# Patient Record
Sex: Male | Born: 1950 | Race: White | Hispanic: No | Smoking: Never smoker
Health system: Southern US, Community
[De-identification: ages and names within clinical notes are randomized; demographics above are authoritative.]

## PROBLEM LIST (undated history)

## (undated) DIAGNOSIS — E663 Overweight: Secondary | ICD-10-CM

## (undated) DIAGNOSIS — I499 Cardiac arrhythmia, unspecified: Secondary | ICD-10-CM

## (undated) DIAGNOSIS — E088 Diabetes mellitus due to underlying condition with unspecified complications: Secondary | ICD-10-CM

## (undated) DIAGNOSIS — I10 Essential (primary) hypertension: Secondary | ICD-10-CM

## (undated) DIAGNOSIS — E782 Mixed hyperlipidemia: Secondary | ICD-10-CM

## (undated) DIAGNOSIS — I480 Paroxysmal atrial fibrillation: Secondary | ICD-10-CM

## (undated) HISTORY — DX: Paroxysmal atrial fibrillation: I48.0

## (undated) HISTORY — DX: Essential (primary) hypertension: I10

## (undated) HISTORY — DX: Mixed hyperlipidemia: E78.2

## (undated) HISTORY — DX: Cardiac arrhythmia, unspecified: I49.9

## (undated) HISTORY — DX: Diabetes mellitus due to underlying condition with unspecified complications: E08.8

## (undated) HISTORY — DX: Overweight: E66.3

---

## 1999-07-23 ENCOUNTER — Ambulatory Visit: Admission: RE | Admit: 1999-07-23 | Discharge: 1999-07-23 | Payer: Self-pay

## 2015-04-26 ENCOUNTER — Other Ambulatory Visit: Payer: Self-pay | Admitting: Family

## 2015-04-26 DIAGNOSIS — R1030 Lower abdominal pain, unspecified: Secondary | ICD-10-CM

## 2015-04-26 DIAGNOSIS — R198 Other specified symptoms and signs involving the digestive system and abdomen: Secondary | ICD-10-CM

## 2015-05-01 ENCOUNTER — Ambulatory Visit
Admission: RE | Admit: 2015-05-01 | Discharge: 2015-05-01 | Disposition: A | Discharge status: Home / Self Care | Payer: BLUE CROSS/BLUE SHIELD | Source: Ambulatory Visit | Attending: Family | Admitting: Family

## 2015-05-01 DIAGNOSIS — R198 Other specified symptoms and signs involving the digestive system and abdomen: Secondary | ICD-10-CM

## 2015-05-01 DIAGNOSIS — R1030 Lower abdominal pain, unspecified: Secondary | ICD-10-CM

## 2015-05-01 MED ORDER — IOPAMIDOL (ISOVUE-300) INJECTION 61%
100.0000 mL | Freq: Once | INTRAVENOUS | Status: AC | PRN
  Administered 2015-05-01: 100 mL via INTRAVENOUS

## 2015-11-22 DIAGNOSIS — M7671 Peroneal tendinitis, right leg: Secondary | ICD-10-CM

## 2015-11-22 HISTORY — DX: Peroneal tendinitis, right leg: M76.71

## 2016-01-09 DIAGNOSIS — N529 Male erectile dysfunction, unspecified: Secondary | ICD-10-CM | POA: Insufficient documentation

## 2016-01-09 DIAGNOSIS — N401 Enlarged prostate with lower urinary tract symptoms: Secondary | ICD-10-CM

## 2016-01-09 DIAGNOSIS — E291 Testicular hypofunction: Secondary | ICD-10-CM

## 2016-01-09 DIAGNOSIS — N138 Other obstructive and reflux uropathy: Secondary | ICD-10-CM

## 2016-01-09 HISTORY — DX: Other obstructive and reflux uropathy: N13.8

## 2016-01-09 HISTORY — DX: Testicular hypofunction: E29.1

## 2016-01-09 HISTORY — DX: Male erectile dysfunction, unspecified: N52.9

## 2016-01-09 HISTORY — DX: Other obstructive and reflux uropathy: N40.1

## 2016-06-14 DIAGNOSIS — J9601 Acute respiratory failure with hypoxia: Secondary | ICD-10-CM

## 2016-06-14 DIAGNOSIS — J159 Unspecified bacterial pneumonia: Secondary | ICD-10-CM | POA: Diagnosis not present

## 2016-06-14 DIAGNOSIS — J189 Pneumonia, unspecified organism: Secondary | ICD-10-CM

## 2016-06-14 DIAGNOSIS — I4891 Unspecified atrial fibrillation: Secondary | ICD-10-CM

## 2016-06-15 DIAGNOSIS — J9601 Acute respiratory failure with hypoxia: Secondary | ICD-10-CM | POA: Diagnosis not present

## 2016-06-15 DIAGNOSIS — J189 Pneumonia, unspecified organism: Secondary | ICD-10-CM | POA: Diagnosis not present

## 2016-06-15 DIAGNOSIS — J159 Unspecified bacterial pneumonia: Secondary | ICD-10-CM | POA: Diagnosis not present

## 2016-06-15 DIAGNOSIS — I4891 Unspecified atrial fibrillation: Secondary | ICD-10-CM | POA: Diagnosis not present

## 2016-06-16 DIAGNOSIS — J189 Pneumonia, unspecified organism: Secondary | ICD-10-CM | POA: Diagnosis not present

## 2016-06-16 DIAGNOSIS — I4891 Unspecified atrial fibrillation: Secondary | ICD-10-CM | POA: Diagnosis not present

## 2016-06-16 DIAGNOSIS — J159 Unspecified bacterial pneumonia: Secondary | ICD-10-CM | POA: Diagnosis not present

## 2016-06-16 DIAGNOSIS — J9601 Acute respiratory failure with hypoxia: Secondary | ICD-10-CM | POA: Diagnosis not present

## 2016-12-16 ENCOUNTER — Encounter: Payer: Self-pay | Admitting: Cardiology

## 2016-12-16 ENCOUNTER — Ambulatory Visit (INDEPENDENT_AMBULATORY_CARE_PROVIDER_SITE_OTHER): Payer: Medicare PPO | Admitting: Cardiology

## 2016-12-16 VITALS — BP 126/80 | HR 63 | Ht 69.0 in | Wt 257.4 lb

## 2016-12-16 DIAGNOSIS — E663 Overweight: Secondary | ICD-10-CM | POA: Insufficient documentation

## 2016-12-16 DIAGNOSIS — E088 Diabetes mellitus due to underlying condition with unspecified complications: Secondary | ICD-10-CM

## 2016-12-16 DIAGNOSIS — I10 Essential (primary) hypertension: Secondary | ICD-10-CM | POA: Diagnosis not present

## 2016-12-16 DIAGNOSIS — E782 Mixed hyperlipidemia: Secondary | ICD-10-CM | POA: Diagnosis not present

## 2016-12-16 DIAGNOSIS — I480 Paroxysmal atrial fibrillation: Secondary | ICD-10-CM

## 2016-12-16 HISTORY — DX: Mixed hyperlipidemia: E78.2

## 2016-12-16 HISTORY — DX: Paroxysmal atrial fibrillation: I48.0

## 2016-12-16 HISTORY — DX: Overweight: E66.3

## 2016-12-16 HISTORY — DX: Diabetes mellitus due to underlying condition with unspecified complications: E08.8

## 2016-12-16 HISTORY — DX: Essential (primary) hypertension: I10

## 2016-12-16 NOTE — Patient Instructions (Signed)
Medication Instructions:   Your physician has recommended you make the following change in your medication:   CHANGE: Metoprolol from 25 mg twice daily to 25 mg ONCE daily   Labwork:  NONE  Testing/Procedures:  Your physician has recommended that you have a pulmonary function test. Pulmonary Function Tests are a group of tests that measure how well air moves in and out of your lungs.   Follow-Up:  Your physician recommends that you schedule a follow-up appointment in: 1 month with Dr. Tomie Chinaevankar.    Any Other Special Instructions Will Be Listed Below (If Applicable).     If you need a refill on your cardiac medications before your next appointment, please call your pharmacy.

## 2016-12-16 NOTE — Progress Notes (Signed)
Cardiology Office Note:    Date:  12/16/2016   ID:  Jesus Mccullough, DOB 12/02/1950, MRN [id number]  PCP:  [name], NP  Cardiologist:  Garwin Brothers, MD   Referring MD: No ref. provider found    ASSESSMENT:    1. PAF (paroxysmal atrial fibrillation) (HCC)   2. 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 Essential hypertension   4. Mixed dyslipidemia   5. Overweight    PLAN:    In order of problems listed above:  1. Paroxysmal atrial fibrillation. The patient has undergone ablation and we will await to see how successful this procedure has been. So far has not had any palpitations or any symptoms.I discussed with the patient atrial fibrillation, disease process. Management and therapy including rate and rhythm control, anticoagulation benefits and potential risks were discussed extensively with the patient. Patient had multiple questions which were answered to patient's satisfaction. 2. Patient's blood pressure stable. He feels a little tired 7, down his metoprolol from 25 twice a day to 25 mg in the morning. He'll be back in a month for a follow-up appointment. 3. Diet was discussed with dyslipidemia and diabetes mellitus. Patient will be seen in follow-up appointment in a month or earlier if he has any concerns. We'll also obtain pulmonary function tests as he is on amiodarone therapy   Medication Adjustments/Labs and Tests Ordered: Current medicines are reviewed at length with the patient today.  Concerns regarding medicines are outlined above.  No orders of the defined types were placed in this encounter.  No orders of the defined types were placed in this encounter.    History of Present Illness:    Jesus Mccullough is a 66 y.o. male who is being seen today for the evaluation of Paroxysmal atrial fibrillation . The patient has essential hypertension and diabetes mellitus and dyslipidemia. He  mentions to me that he has undergone ablation and he feels better. He just feels tired at times. At the time of my evaluation is alert awake oriented and in no distress. I evaluated the patient extensively and answered questions to his satisfaction. His no chest pain orthopnea or PND. He leads a sedentary lifestyle. He has not felt any palpitations after the procedure. He wants to follow-up with me.  Taken care of this gentleman previously and now he wants to get established at this practice.  Past Medical History:  Diagnosis Date  . Arrhythmia     History reviewed. No pertinent surgical history.  Current Medications: Current Meds  Medication Sig  . allopurinol (ZYLOPRIM) 300 MG tablet Take 300 mg by mouth daily.  Marland Kitchen amiodarone (PACERONE) 200 MG tablet Take 200 mg by mouth daily.  . empagliflozin (JARDIANCE) 10 MG TABS tablet Take 10 mg by mouth daily.  Marland Kitchen gabapentin (NEURONTIN) 800 MG tablet Take 800 mg by mouth 3 (three) times daily.  Marland Kitchen glipiZIDE (GLUCOTROL) 10 MG tablet Take 10 mg by mouth daily.  . metFORMIN (GLUCOPHAGE) 500 MG tablet Take 1,000 mg by mouth 2 (two) times daily.  . metoprolol tartrate (LOPRESSOR) 50 MG tablet Take 25 mg by mouth 2 (two) times daily.  Marland Kitchen omeprazole (PRILOSEC) 40 MG capsule Take 40 mg by mouth daily.  . pravastatin (PRAVACHOL) 40 MG tablet Take 40 mg by mouth daily.  . tamsulosin (FLOMAX) 0.4 MG CAPS capsule Take 0.4 mg by mouth daily.  Marland Kitchen testosterone cypionate (DEPOTESTOSTERONE CYPIONATE) 200 MG/ML injection Inject  into the muscle every 21 ( twenty-one) days.  [name]. XARELTO 20 MG TABS tablet Take 20 mg by mouth daily.     Allergies:   Antihistamines, chlorpheniramine-type; Aspirin; and Nyquil multi-symptom  [pseudoeph-doxylamine-dm-apap]   Social History   Social History  . Marital status: Married    Spouse name: N/A  . Number of children: N/A  . Years of education: N/A   Social History Main Topics  . Smoking status: Never Smoker  . Smokeless  tobacco: Never Used  . Alcohol use No  . Drug use: No  . Sexual activity: Not Asked   Other Topics Concern  . None   Social History Narrative  . None     Family History: The patient's family history includes Heart attack in his maternal uncle; Heart disease in his maternal uncle and mother.  ROS:   Please see the history of present illness.    All other systems reviewed and are negative.  EKGs/Labs/Other Studies Reviewed:    The following studies were reviewed today: I reviewed records and office records extensively and answered questions to patient's satisfaction. EKG done today revealed sinus rhythm, first-degree AV block and nonspecific ST-T changes   Recent Labs: No results found for requested labs within last 8760 hours.  Recent Lipid Panel No results found for: CHOL, TRIG, HDL, CHOLHDL, VLDL, LDLCALC, LDLDIRECT  Physical Exam:    VS:  BP 126/80   Pulse 63   Ht 5\' 9"  (1.753 m)   Wt 257 lb 6.4 oz (116.8 kg)   SpO2 98%   BMI 38.01 kg/m     Wt Readings from Last 3 Encounters:  12/16/16 257 lb 6.4 oz (116.8 kg)     GEN: Patient is in no acute distress HEENT: Normal NECK: No JVD; No carotid bruits LYMPHATICS: No lymphadenopathy CARDIAC: S1 S2 regular, 2/6 systolic murmur at the apex. RESPIRATORY:  Clear to auscultation without rales, wheezing or rhonchi  ABDOMEN: Soft, non-tender, non-distended MUSCULOSKELETAL:  No edema; No deformity  SKIN: Warm and dry NEUROLOGIC:  Alert and oriented x 3 PSYCHIATRIC:  Normal affect    Signed, Garwin Brothersajan R [name], MD  12/16/2016 9:28 AM    [facility]

## 2016-12-24 ENCOUNTER — Telehealth: Payer: Self-pay | Admitting: Cardiology

## 2016-12-24 NOTE — Telephone Encounter (Signed)
Waiting on a call back about his breathing test

## 2016-12-25 ENCOUNTER — Telehealth: Payer: Self-pay

## 2016-12-25 NOTE — Telephone Encounter (Signed)
Patient has called 3 times to discuss sleep test please call today.cn

## 2016-12-25 NOTE — Telephone Encounter (Signed)
Called [facility] to check the status of PFT study. LM to call back or to fax results.

## 2016-12-25 NOTE — Telephone Encounter (Signed)
Pt missed 1st scheduled app for PFT study at dr Chodri's office. This have been reschduled 9/17 @ 1:30 pt is aware  Of this.

## 2017-01-15 ENCOUNTER — Ambulatory Visit (INDEPENDENT_AMBULATORY_CARE_PROVIDER_SITE_OTHER): Payer: Medicare PPO | Admitting: Cardiology

## 2017-01-15 ENCOUNTER — Encounter: Payer: Self-pay | Admitting: Cardiology

## 2017-01-15 ENCOUNTER — Ambulatory Visit: Payer: Medicare PPO | Admitting: Cardiology

## 2017-01-15 VITALS — BP 112/74 | HR 69 | Ht 69.0 in | Wt 259.0 lb

## 2017-01-15 DIAGNOSIS — I480 Paroxysmal atrial fibrillation: Secondary | ICD-10-CM | POA: Diagnosis not present

## 2017-01-15 DIAGNOSIS — E782 Mixed hyperlipidemia: Secondary | ICD-10-CM | POA: Diagnosis not present

## 2017-01-15 DIAGNOSIS — I10 Essential (primary) hypertension: Secondary | ICD-10-CM

## 2017-01-15 DIAGNOSIS — E663 Overweight: Secondary | ICD-10-CM

## 2017-01-15 DIAGNOSIS — E088 Diabetes mellitus due to underlying condition with unspecified complications: Secondary | ICD-10-CM | POA: Diagnosis not present

## 2017-01-15 NOTE — Patient Instructions (Signed)
Medication Instructions:  Xarelto samples given  eliquis  twice daily pradaxa 150 mg twice daily  Labwork: None  Testing/Procedures: You had an EKG  Follow-Up: Dr. Johney Frame will call for an appointment  Any Other Special Instructions Will Be Listed Below (If Applicable).     If you need a refill on your cardiac medications before your next appointment, please call your pharmacy.

## 2017-01-15 NOTE — Addendum Note (Signed)
Addended by: Craige Cotta on: 01/15/2017 04:42 PM   Modules accepted: Orders

## 2017-01-15 NOTE — Progress Notes (Signed)
Cardiology Office Note:    Date:  01/15/2017   ID:  Jesus Mccullough, DOB Jul 02, 1950, MRN [id number]  PCP:  [name], NP  Cardiologist:  Garwin Brothers, MD   Referring MD: [name], NP    ASSESSMENT:    1. Essential hypertension   2. PAF (paroxysmal atrial fibrillation) (HCC)   3. 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 Mixed dyslipidemia   5. Overweight    PLAN:    In order of problems listed above:  1. I discussed my findings with the patient at extensive length. It appears that he will need to be evaluated electrophysiology colleagues. I we will refer him to our electrophysiology colleagues who has interest in atrial fibrillation Dr. Johney Frame and this Dr. Stann Mainland decide whether the patient needs any further evaluation for possible ablation therapy or medications such as dofetilide and to see whether he needs long-term amiodarone. 2. I stressed the importance of uninterrupted anticoagulation and he is on this at this time. 3. Patient's blood pressure stable and diet was discussed with dyslipidemia and diabetes mellitus. Weight reduction was stressed.We will be seen in follow-up appointment in 6 months or earlier if he has any concerns.   Medication Adjustments/Labs and Tests Ordered: Current medicines are reviewed at length with the patient today.  Concerns regarding medicines are outlined above.  Orders Placed This Encounter  Procedures  . EKG 12-Lead   No orders of the defined types were placed in this encounter.    Chief Complaint  Patient presents with  . Follow-up    PFT Study done at Dr. Terrilee Croak office      History of Present Illness:    Jesus Mccullough is a 66 y.o. male . Patient has history of atrial fibrillation. He has undergone ablation at [facility].Is here for follow-up. He denies any problems at this time except fatigue on exertion. No chest pain  orthopnea or PND. He is on anticoagulation. Patient takes 200 mg of amiodarone on a regular basis. At the time of my evaluation is alert awake oriented and in no distress.  Past Medical History:  Diagnosis Date  . Arrhythmia     History reviewed. No pertinent surgical history.  Current Medications: Current Meds  Medication Sig  . allopurinol (ZYLOPRIM) 300 MG tablet Take 300 mg by mouth daily.  Marland Kitchen amiodarone (PACERONE) 200 MG tablet Take 200 mg by mouth daily.  . empagliflozin (JARDIANCE) 10 MG TABS tablet Take 10 mg by mouth daily.  Marland Kitchen gabapentin (NEURONTIN) 800 MG tablet Take 800 mg by mouth 3 (three) times daily.  Marland Kitchen glipiZIDE (GLUCOTROL) 10 MG tablet Take 10 mg by mouth daily.  . metFORMIN (GLUCOPHAGE) 500 MG tablet Take 1,000 mg by mouth 2 (two) times daily.  . metoprolol tartrate (LOPRESSOR) 50 MG tablet Take 25 mg by mouth 2 (two) times daily.  Marland Kitchen omeprazole (PRILOSEC) 40 MG capsule Take 40 mg by mouth daily.  . pravastatin (PRAVACHOL) 40 MG tablet Take 40 mg by mouth daily.  . tamsulosin (FLOMAX) 0.4 MG CAPS capsule Take 0.4 mg by mouth daily.  Marland Kitchen testosterone cypionate (DEPOTESTOSTERONE CYPIONATE) 200 MG/ML injection Inject into the muscle every 21 ( twenty-one) days.  [name] 20 MG TABS tablet Take 20 mg by mouth daily.     Allergies:   Antihistamines, chlorpheniramine-type; Aspirin; and Nyquil multi-symptom  [pseudoeph-doxylamine-dm-apap]   Social History   Social History  .  Marital status: Married    Spouse name: N/A  . Number of children: N/A  . Years of education: N/A   Social History Main Topics  . Smoking status: Never Smoker  . Smokeless tobacco: Never Used  . Alcohol use No  . Drug use: No  . Sexual activity: Not Asked   Other Topics Concern  . None   Social History Narrative  . None     Family History: The patient's family history includes Heart attack in his maternal uncle; Heart disease in his maternal uncle and mother.  ROS:   Please see the  history of present illness.    All other systems reviewed and are negative.  EKGs/Labs/Other Studies Reviewed:    The following studies were reviewed today: I discussed my findings with the patient today. EKG reveals atrial fibrillation with nonspecific ST-T changes and heart rate is fairly well-controlled.   Recent Labs: No results found for requested labs within last 8760 hours.  Recent Lipid Panel No results found for: CHOL, TRIG, HDL, CHOLHDL, VLDL, LDLCALC, LDLDIRECT  Physical Exam:    VS:  BP 112/74   Pulse 69   Ht  (1.753 m)   Wt 259 lb (117.5 kg)   SpO2 94%   BMI 38.25 kg/m     Wt Readings from Last 3 Encounters:  01/15/17 259 lb (117.5 kg)  12/16/16 257 lb 6.4 oz (116.8 kg)     GEN: Patient is in no acute distress HEENT: Normal NECK: No JVD; No carotid bruits LYMPHATICS: No lymphadenopathy CARDIAC: Hear sounds regular, 2/6 systolic murmur at the apex. RESPIRATORY:  Clear to auscultation without rales, wheezing or rhonchi  ABDOMEN: Soft, non-tender, non-distended MUSCULOSKELETAL:  No edema; No deformity  SKIN: Warm and dry NEUROLOGIC:  Alert and oriented x 3 PSYCHIATRIC:  Normal affect   Signed, Garwin Brothers, MD  01/15/2017 4:34 PM    [facility]

## 2017-01-21 ENCOUNTER — Ambulatory Visit (INDEPENDENT_AMBULATORY_CARE_PROVIDER_SITE_OTHER): Payer: Medicare PPO | Admitting: Cardiology

## 2017-01-21 ENCOUNTER — Encounter (INDEPENDENT_AMBULATORY_CARE_PROVIDER_SITE_OTHER): Payer: Self-pay

## 2017-01-21 ENCOUNTER — Encounter: Payer: Self-pay | Admitting: Cardiology

## 2017-01-21 VITALS — BP 126/84 | HR 69 | Ht 69.0 in | Wt 259.8 lb

## 2017-01-21 DIAGNOSIS — I10 Essential (primary) hypertension: Secondary | ICD-10-CM

## 2017-01-21 DIAGNOSIS — E785 Hyperlipidemia, unspecified: Secondary | ICD-10-CM

## 2017-01-21 DIAGNOSIS — I481 Persistent atrial fibrillation: Secondary | ICD-10-CM

## 2017-01-21 DIAGNOSIS — I4819 Other persistent atrial fibrillation: Secondary | ICD-10-CM

## 2017-01-21 NOTE — Patient Instructions (Signed)
Medication Instructions: Your physician recommends that you continue on your current medications as directed. Please refer to the Current Medication list given to you today.   Labwork: None ordered  Procedures/Testing: None ordered  Follow-Up: Your physician recommends that you schedule a follow-up appointment in: December, 2018 at our [facility] office with Dr. Elberta Fortis.   Any Additional Special Instructions Will Be Listed Below (If Applicable).     If you need a refill on your cardiac medications before your next appointment, please call your pharmacy.

## 2017-01-21 NOTE — Progress Notes (Signed)
Electrophysiology Office Note   Date:  01/21/2017   ID:  Jesus Mccullough, Jesus Mccullough [birth year], MRN [id number]  PCP:  [name], NP  Cardiologist:  [name] Primary Electrophysiologist:  [name], MD    Chief Complaint  Patient presents with  . Advice Only    PAF     History of Present Illness: Jesus Mccullough is a 66 y.o. male who is being seen today for the evaluation of atrial fibrillation at the request of [name], [name], NP. Presenting today for electrophysiology evaluation. He has a history of paroxysmal atrial fibrillation, diabetes, hypertension, and hyperlipidemia. He had ablation for atrial fibrillation at [facility]. He returned to clinic in October 2018 and was found to be in recurrent atrial fibrillation. He had cryoablation on 11/25/16. According to the patient, he does not have symptoms from his atrial fibrillation. He has no shortness of breath, fatigue or palpitations.    Today, he denies symptoms of palpitations, chest pain, shortness of breath, orthopnea, PND, lower extremity edema, claudication, dizziness, presyncope, syncope, bleeding, or neurologic sequela. The patient is tolerating medications without difficulties.    Past Medical History:  Diagnosis Date  . Arrhythmia   . Diabetes mellitus due to underlying condition with unspecified complications (HCC) 12/16/2016  . Essential hypertension 12/16/2016  . Mixed dyslipidemia 12/16/2016  . Overweight 12/16/2016  . PAF (paroxysmal atrial fibrillation) (HCC) 12/16/2016   No past surgical history on file.   Current Outpatient Prescriptions  Medication Sig Dispense Refill  . allopurinol (ZYLOPRIM) 300 MG tablet Take 300 mg by mouth daily.    Marland Kitchen amiodarone (PACERONE) 200 MG tablet Take 200 mg by mouth daily.    . Cholecalciferol (VITAMIN D3) 1000 units CAPS Take 1 capsule by mouth daily.    . empagliflozin (JARDIANCE) 10 MG TABS tablet Take 10 mg by mouth daily.    Marland Kitchen gabapentin (NEURONTIN) 800 MG  tablet Take 800 mg by mouth 3 (three) times daily.    Marland Kitchen glipiZIDE (GLUCOTROL) 10 MG tablet Take 10 mg by mouth daily.    . metFORMIN (GLUCOPHAGE) 500 MG tablet Take 1,000 mg by mouth 2 (two) times daily.    . metoprolol tartrate (LOPRESSOR) 50 MG tablet Take 25 mg by mouth 2 (two) times daily.    . Omega-3 Fatty Acids (FISH OIL) 1000 MG CAPS Take 1 capsule by mouth daily.    Marland Kitchen omeprazole (PRILOSEC) 40 MG capsule Take 40 mg by mouth daily.    Marland Kitchen POTASSIUM PO Take 1 tablet by mouth daily.    . pravastatin (PRAVACHOL) 40 MG tablet Take 40 mg by mouth daily.    . tamsulosin (FLOMAX) 0.4 MG CAPS capsule Take 0.4 mg by mouth daily.    Marland Kitchen testosterone cypionate (DEPOTESTOSTERONE CYPIONATE) 200 MG/ML injection Inject into the muscle every 21 ( twenty-one) days.    [name] 20 MG TABS tablet Take 20 mg by mouth daily.     No current facility-administered medications for this visit.     Allergies:   Antihistamines, chlorpheniramine-type; Aspirin; and Nyquil multi-symptom  [pseudoeph-doxylamine-dm-apap]   Social History:  The patient  reports that he has never smoked. He has never used smokeless tobacco. He reports that he does not drink alcohol or use drugs.   Family History:  The patient's family history includes Heart attack in his maternal uncle; Heart disease in his maternal uncle and mother.    ROS:  Please see the history of present illness.   Otherwise, review of  systems is positive for none.   All other systems are reviewed and negative.    PHYSICAL EXAM: VS:  BP 126/84   Pulse 69   Ht  (1.753 m)   Wt 259 lb 12.8 oz (117.8 kg)   SpO2 93%   BMI 38.37 kg/m  , BMI Body mass index is 38.37 kg/m. GEN: Well nourished, well developed, in no acute distress  HEENT: normal  Neck: no JVD, carotid bruits, or masses Cardiac: RRR; no murmurs, rubs, or gallops,no edema  Respiratory:  clear to auscultation bilaterally, normal work of breathing GI: soft, nontender, nondistended, + BS MS: no  deformity or atrophy  Skin: warm and dry Neuro:  Strength and sensation are intact Psych: euthymic mood, full affect  EKG:  EKG is ordered today. Personal review of the ekg ordered 01/15/17 shows atrial fibrillation, rate 95   Recent Labs: No results found for requested labs within last 8760 hours.    Lipid Panel  No results found for: CHOL, TRIG, HDL, CHOLHDL, VLDL, LDLCALC, LDLDIRECT   Wt Readings from Last 3 Encounters:  01/21/17 259 lb 12.8 oz (117.8 kg)  01/15/17 259 lb (117.5 kg)  12/16/16 257 lb 6.4 oz (116.8 kg)      Other studies Reviewed: Additional studies/ records that were reviewed today include: TEE 11/21/16 Review of the above records today demonstrates:  Normal mitral valve structure and mobility. Mild mitral regurgitation. Aortic Valve Normal tricuspid aortic valve with pliable leaflets, no stenosis or insufficiency. Tricuspid Valve Structurally normal tricuspid valve with no stenosis. Pulmonic Valve Normal pulmonic valve structure and mobility. Left Atrium Moderately dilated left atrium. Left Ventricle Normal left ventricle size and systolic function, with no segmental abnormality. Right Atrium Normal right atrium. Right Ventricle Normal right ventricle structure and function. Pericardial Effusion No evidence of pericardial effusion. Pleural Effusion No evidence of pleural effusion. Miscellaneous The aorta is within normal limits. IVC is normal and collapses   ASSESSMENT AND PLAN:  1.  Paroxysmal atrial fibrillation: Status post cryoablation 11/23/16. Currently on amiodarone and Xarelto. He came in the cardiology clinic and was noted to be in atrial fibrillation and thus was referred. He is back in sinus rhythm now based on auscultation. He is still within his three-month blanking period for atrial fibrillation ablation and thus I do not feel like any medication changes are needed. I did discuss with him possible med changes in the future as well  as repeat ablation which she would be amenable to. I Lilyanna Lunt see him back in December for further discussions.  This patients CHA2DS2-VASc Score and unadjusted Ischemic Stroke Rate (% per year) is equal to 2.2 % stroke rate/year from a score of 2  Above score calculated as 1 point each if present [CHF, HTN, DM, Vascular=MI/PAD/Aortic Plaque, Age if 65-74, or Male] Above score calculated as 2 points each if present [Age > 75, or Stroke/TIA/TE]  2. Hypertension: Well-controlled today. No changes.  3. Hyperlipidemia: Continue pravastatin per primary cardiology.   Current medicines are reviewed at length with the patient today.   The patient does not have concerns regarding his medicines.  The following changes were made today:  none  Labs/ tests ordered today include:  No orders of the defined types were placed in this encounter.    Disposition:   FU with Jerianne Anselmo 2 months  Signed, Issa Kosmicki Jorja Loa, MD  01/21/2017 2:34 PM     [facility] [address] [place] [address] [phone] (office) (  [phone] (fax)

## 2017-02-24 ENCOUNTER — Telehealth: Payer: Self-pay

## 2017-02-24 NOTE — Telephone Encounter (Signed)
Attempted to inform the patient of her PFT results; however, the number on file is currently not in service.

## 2017-03-17 NOTE — Progress Notes (Signed)
Electrophysiology Office Note   Date:  03/18/2017   ID:  Jesus Mccullough, DOB 08/09/1950, MRN [id number]  PCP:  Jesus SeverinYork, Jesus F, NP  Cardiologist:  Jesus Mccullough Primary Electrophysiologist:  Jesus LemmingWill Martin Cordie Buening, MD    Chief Complaint  Patient presents with  . Follow-up    PAF     History of Present Illness: Jesus Mccullough is a 66 y.o. male who is being seen today for the evaluation of atrial fibrillation at the request of Mccullough, Jesus F, NP. Presenting today for electrophysiology evaluation. He has a history of paroxysmal atrial fibrillation, diabetes, hypertension, and hyperlipidemia. He had ablation for atrial fibrillation at [facility]. He returned to clinic in October 2018 and was found to be in recurrent atrial fibrillation. He had cryoablation on 11/25/16. According to the patient, he does not have symptoms from his atrial fibrillation. He has no shortness of breath, fatigue or palpitations.  Today, denies symptoms of palpitations, chest pain, shortness of breath, orthopnea, PND, lower extremity edema, claudication, dizziness, presyncope, syncope, bleeding, or neurologic sequela. The patient is tolerating medications without difficulties.  And feeling well without major complaint.  He does not have fatigue, weakness, or shortness of breath.   Past Medical History:  Diagnosis Date  . Arrhythmia   . Diabetes mellitus due to underlying condition with unspecified complications (HCC) 12/16/2016  . Essential hypertension 12/16/2016  . Mixed dyslipidemia 12/16/2016  . Overweight 12/16/2016  . PAF (paroxysmal atrial fibrillation) (HCC) 12/16/2016   No past surgical history on file.   Current Outpatient Medications  Medication Sig Dispense Refill  . allopurinol (ZYLOPRIM) 300 MG tablet Take 300 mg by mouth daily.    Marland Kitchen. amiodarone (PACERONE) 200 MG tablet Take 200 mg by mouth daily.    . Cholecalciferol (VITAMIN D3) 1000 units CAPS Take 1 capsule by mouth daily.    . empagliflozin  (JARDIANCE) 10 MG TABS tablet Take 10 mg by mouth daily.    Marland Kitchen. glipiZIDE (GLUCOTROL) 10 MG tablet Take 10 mg by mouth daily.    Marland Kitchen. lisinopril (PRINIVIL,ZESTRIL) 10 MG tablet Take 10 mg by mouth 2 (two) times daily.    . metFORMIN (GLUCOPHAGE) 500 MG tablet Take 1,000 mg by mouth 2 (two) times daily.    . metoprolol tartrate (LOPRESSOR) 50 MG tablet Take 25 mg by mouth 2 (two) times daily.    . Omega-3 Fatty Acids (FISH OIL) 1000 MG CAPS Take 1 capsule by mouth daily.    Marland Kitchen. omeprazole (PRILOSEC) 40 MG capsule Take 40 mg by mouth daily.    Marland Kitchen. POTASSIUM PO Take 1 tablet by mouth daily.    . pravastatin (PRAVACHOL) 40 MG tablet Take 40 mg by mouth daily.    . pregabalin (LYRICA) 150 MG capsule Take 150 mg by mouth 2 (two) times daily.    . tamsulosin (FLOMAX) 0.4 MG CAPS capsule Take 0.4 mg by mouth daily.    Marland Kitchen. testosterone cypionate (DEPOTESTOSTERONE CYPIONATE) 200 MG/ML injection Inject into the muscle every 21 ( twenty-one) days.    Jesus Mccullough. XARELTO 20 MG TABS tablet Take 20 mg by mouth daily.     No current facility-administered medications for this visit.     Allergies:   Antihistamines, chlorpheniramine-type; Aspirin; and Nyquil multi-symptom  [pseudoeph-doxylamine-dm-apap]   Social History:  The patient  reports that  has never smoked. he has never used smokeless tobacco. He reports that he does not drink alcohol or use drugs.   Family History:  The patient's family history includes  Heart attack in his maternal uncle; Heart disease in his maternal uncle and mother.   ROS:  Please see the history of present illness.   Otherwise, review of systems is positive for none.   All other systems are reviewed and negative.   PHYSICAL EXAM: VS:  BP 132/81   Pulse (!) 52   Ht 5\' 9"  (1.753 m)   Wt 261 lb (118.4 kg)   SpO2 93%   BMI 38.54 kg/m  , BMI Body mass index is 38.54 kg/m. GEN: Well nourished, well developed, in no acute distress  HEENT: normal  Neck: no JVD, carotid bruits, or masses Cardiac:  RRR; no murmurs, rubs, or gallops,no edema  Respiratory:  clear to auscultation bilaterally, normal work of breathing GI: soft, nontender, nondistended, + BS MS: no deformity or atrophy  Skin: warm and dry Neuro:  Strength and sensation are intact Psych: euthymic mood, full affect  EKG:  EKG is ordered today. Personal review of the ekg ordered shows low atrial rhythm, possible inferior infarct, occasional PVCs, possible anteroseptal infarct, rate 68  Recent Labs: No results found for requested labs within last 8760 hours.  Lipid Panel  No results found for: CHOL, TRIG, HDL, CHOLHDL, VLDL, LDLCALC, LDLDIRECT   Wt Readings from Last 3 Encounters:  03/18/17 261 lb (118.4 kg)  01/21/17 259 lb 12.8 oz (117.8 kg)  01/15/17 259 lb (117.5 kg)      Other studies Reviewed: Additional studies/ records that were reviewed today include: TEE 11/21/16 Review of the above records today demonstrates:  Normal mitral valve structure and mobility. Mild mitral regurgitation. Aortic Valve Normal tricuspid aortic valve with pliable leaflets, no stenosis or insufficiency. Tricuspid Valve Structurally normal tricuspid valve with no stenosis. Pulmonic Valve Normal pulmonic valve structure and mobility. Left Atrium Moderately dilated left atrium. Left Ventricle Normal left ventricle size and systolic function, with no segmental abnormality. Right Atrium Normal right atrium. Right Ventricle Normal right ventricle structure and function. Pericardial Effusion No evidence of pericardial effusion. Pleural Effusion No evidence of pleural effusion. Miscellaneous The aorta is within normal limits. IVC is normal and collapses   ASSESSMENT AND PLAN:  1.  Paroxysmal atrial fibrillation: Cryoablation 11/23/16.  Currently on amiodarone and Xarelto.  Did have a early return of atrial fibrillation but is now in a low atrial rhythm.  Due to that, we Jesus Mccullough continue amiodarone for the time being likely be  able to stop amiodarone at his next visit.     This patients CHA2DS2-VASc Score and unadjusted Ischemic Stroke Rate (% per year) is equal to 2.2 % stroke rate/year from a score of 2  Above score calculated as 1 point each if present [CHF, HTN, DM, Vascular=MI/PAD/Aortic Plaque, Age if 65-74, or Male] Above score calculated as 2 points each if present [Age > 75, or Stroke/TIA/TE]   2. Hypertension: Well-controlled today.  No changes  3. Hyperlipidemia: Pravastatin per primary cardiology   Current medicines are reviewed at length with the patient today.   The patient does not have concerns regarding his medicines.  The following changes were made today:  none  Labs/ tests ordered today include:  Orders Placed This Encounter  Procedures  . TSH  . Hepatic function panel     Disposition:   FU with Dannah Ryles 6 months  Signed, Jamone Garrido Jorja LoaMartin Shaili Donalson, MD  03/18/2017 3:52 PM     [facility] [address] [place] [address] [phone] (office) [phone] (fax)

## 2017-03-18 ENCOUNTER — Encounter: Payer: Self-pay | Admitting: Cardiology

## 2017-03-18 ENCOUNTER — Ambulatory Visit: Payer: Medicare PPO | Admitting: Cardiology

## 2017-03-18 VITALS — BP 132/81 | HR 52 | Ht 69.0 in | Wt 261.0 lb

## 2017-03-18 DIAGNOSIS — I480 Paroxysmal atrial fibrillation: Secondary | ICD-10-CM

## 2017-03-18 DIAGNOSIS — E785 Hyperlipidemia, unspecified: Secondary | ICD-10-CM | POA: Diagnosis not present

## 2017-03-18 DIAGNOSIS — I10 Essential (primary) hypertension: Secondary | ICD-10-CM

## 2017-03-18 DIAGNOSIS — Z79899 Other long term (current) drug therapy: Secondary | ICD-10-CM

## 2017-03-18 NOTE — Patient Instructions (Signed)
Medication Instructions:  Your physician recommends that you continue on your current medications as directed. Please refer to the Current Medication list given to you today.  * If you need a refill on your cardiac medications before your next appointment, please call your pharmacy. *  Labwork: Amiodarone medication surveillance lab work: TSH & LFT  Testing/Procedures: None ordered  Follow-Up: Your physician wants you to follow-up in: 6 months with Dr. [name].  You will receive a reminder letter in the mail two months in advance. If you don't receive a letter, please call our office to schedule the follow-up appointment.  Thank you for choosing [facility]!!   Dory HornSherri Latisia Hilaire, RN [phone]

## 2017-03-19 LAB — HEPATIC FUNCTION PANEL
ALBUMIN: 4.6 g/dL (ref 3.6–4.8)
ALK PHOS: 69 IU/L (ref 39–117)
ALT: 18 IU/L (ref 0–44)
AST: 20 IU/L (ref 0–40)
Bilirubin Total: 0.3 mg/dL (ref 0.0–1.2)
Bilirubin, Direct: 0.12 mg/dL (ref 0.00–0.40)
TOTAL PROTEIN: 7.4 g/dL (ref 6.0–8.5)

## 2017-03-19 LAB — TSH: TSH: 15.12 u[IU]/mL — ABNORMAL HIGH (ref 0.450–4.500)

## 2017-03-19 NOTE — Addendum Note (Signed)
Addended by: Baird LyonsPRICE, Tahra Hitzeman L on: 03/19/2017 06:02 PM   Modules accepted: Orders

## 2017-04-01 ENCOUNTER — Telehealth: Payer: Self-pay

## 2017-04-01 NOTE — Telephone Encounter (Signed)
Was finally able to get in touch with the patient as his phone was out of service previously. Patient was informed of his PFT results and requested that they be repeated in 1 year.

## 2017-06-23 DIAGNOSIS — I951 Orthostatic hypotension: Secondary | ICD-10-CM

## 2017-06-23 DIAGNOSIS — Z7901 Long term (current) use of anticoagulants: Secondary | ICD-10-CM

## 2017-06-23 HISTORY — DX: Long term (current) use of anticoagulants: Z79.01

## 2017-06-23 HISTORY — DX: Orthostatic hypotension: I95.1

## 2017-07-23 ENCOUNTER — Encounter: Payer: Self-pay | Admitting: Cardiology

## 2017-07-23 ENCOUNTER — Ambulatory Visit: Payer: Medicare PPO | Admitting: Cardiology

## 2017-07-23 VITALS — BP 130/70 | HR 81 | Ht 69.0 in | Wt 252.0 lb

## 2017-07-23 DIAGNOSIS — I10 Essential (primary) hypertension: Secondary | ICD-10-CM | POA: Diagnosis not present

## 2017-07-23 DIAGNOSIS — E088 Diabetes mellitus due to underlying condition with unspecified complications: Secondary | ICD-10-CM | POA: Diagnosis not present

## 2017-07-23 DIAGNOSIS — I480 Paroxysmal atrial fibrillation: Secondary | ICD-10-CM

## 2017-07-23 NOTE — Progress Notes (Signed)
Cardiology Office Note:    Date:  07/23/2017   ID:  Jesus Mccullough, DOB 1950/05/29, MRN [id number]  PCP:  [name], NP  Cardiologist:  Garwin Brothers, MD   Referring MD: [name], NP    ASSESSMENT:    1. PAF (paroxysmal atrial fibrillation) (HCC)   2. Essential hypertension    PLAN:    In order of problems listed above:  1. I discussed my findings with the patient at extensive length.  His heart rate is well controlled and he is on optimal anticoagulation.  In view of significant recurrent tachyarrhythmias which are symptomatic, I told him that I will make him a referral to Dr. Johney Frame for a second opinion.  This is a young gentleman and he is on amiodarone therapy.  I think he could get another evaluation for possible ablation and if this is successful then he may be able to go off some of his medications.  He is excited and interested to learn more about this.  At this time he will continue his current medications and we will refer him to our electrophysiology colleague for assessment at that direction. 2. Patient will be seen in follow-up appointment in 6 months or earlier if the patient has any concerns    Medication Adjustments/Labs and Tests Ordered: Current medicines are reviewed at length with the patient today.  Concerns regarding medicines are outlined above.  Orders Placed This Encounter  Procedures  . Ambulatory referral to Cardiac Electrophysiology  . EKG 12-Lead   No orders of the defined types were placed in this encounter.    Chief Complaint  Patient presents with  . Follow-up  . Atrial Fibrillation     History of Present Illness:    Jesus Mccullough is a 67 y.o. male.  Patient has paroxysmal atrial fibrillation.  He has been on amiodarone therapy in the past.  He has also undergone ablation which has been unsuccessful.  The patient is frustrated because recently was admitted to the hospital with tachyarrhythmias and also significant  hypotension.  He denies any chest pain orthopnea or PND.  He takes care of activities of daily living.  No chest pain orthopnea or PND.  He is worried that the symptoms may come back again and its her quality of life issue for him.  At the time of my evaluation, the patient is alert awake oriented and in no distress.  Past Medical History:  Diagnosis Date  . Arrhythmia   . Diabetes mellitus due to underlying condition with unspecified complications (HCC) 12/16/2016  . Essential hypertension 12/16/2016  . Mixed dyslipidemia 12/16/2016  . Overweight 12/16/2016  . PAF (paroxysmal atrial fibrillation) (HCC) 12/16/2016    History reviewed. No pertinent surgical history.  Current Medications: Current Meds  Medication Sig  . allopurinol (ZYLOPRIM) 300 MG tablet Take 300 mg by mouth daily.  Marland Kitchen amiodarone (PACERONE) 200 MG tablet Take 200 mg by mouth daily.  Marland Kitchen atorvastatin (LIPITOR) 10 MG tablet   . Cholecalciferol (VITAMIN D3) 1000 units CAPS Take 1 capsule by mouth daily.  . empagliflozin (JARDIANCE) 10 MG TABS tablet Take 10 mg by mouth daily.  Marland Kitchen gabapentin (NEURONTIN) 800 MG tablet Take by mouth.  Marland Kitchen glipiZIDE (GLUCOTROL) 10 MG tablet Take 10 mg by mouth daily.  Marland Kitchen levothyroxine (SYNTHROID, LEVOTHROID) 50 MCG tablet Take 50 mcg by mouth daily.  Marland Kitchen lisinopril (PRINIVIL,ZESTRIL) 5 MG tablet   . metFORMIN (GLUCOPHAGE) 500 MG tablet Take 1,000 mg by mouth 2 (  two) times daily.  . metoprolol tartrate (LOPRESSOR) 50 MG tablet Take 25 mg by mouth 2 (two) times daily.  . Omega-3 Fatty Acids (FISH OIL) 1000 MG CAPS Take 1 capsule by mouth daily.  Marland Kitchen omeprazole (PRILOSEC) 40 MG capsule Take 40 mg by mouth daily.  Marland Kitchen POTASSIUM PO Take 1 tablet by mouth daily.  . tamsulosin (FLOMAX) 0.4 MG CAPS capsule Take 0.4 mg by mouth daily.  Marland Kitchen testosterone cypionate (DEPOTESTOSTERONE CYPIONATE) 200 MG/ML injection Inject into the muscle every 21 ( twenty-one) days.  [name] 20 MG TABS tablet Take 20 mg by mouth daily.  .  [DISCONTINUED] lisinopril (PRINIVIL,ZESTRIL) 10 MG tablet Take 10 mg by mouth 2 (two) times daily.     Allergies:   Antihistamines, chlorpheniramine-type; Aspirin; and Nyquil multi-symptom  [pseudoeph-doxylamine-dm-apap]   Social History   Socioeconomic History  . Marital status: Married    Spouse name: Not on file  . Number of children: Not on file  . Years of education: Not on file  . Highest education level: Not on file  Occupational History  . Not on file  Social Needs  . Financial resource strain: Not on file  . Food insecurity:    Worry: Not on file    Inability: Not on file  . Transportation needs:    Medical: Not on file    Non-medical: Not on file  Tobacco Use  . Smoking status: Never Smoker  . Smokeless tobacco: Never Used  Substance and Sexual Activity  . Alcohol use: No  . Drug use: No  . Sexual activity: Not on file  Lifestyle  . Physical activity:    Days per week: Not on file    Minutes per session: Not on file  . Stress: Not on file  Relationships  . Social connections:    Talks on phone: Not on file    Gets together: Not on file    Attends religious service: Not on file    Active member of club or organization: Not on file    Attends meetings of clubs or organizations: Not on file    Relationship status: Not on file  Other Topics Concern  . Not on file  Social History Narrative  . Not on file     Family History: The patient's family history includes Heart attack in his maternal uncle; Heart disease in his maternal uncle and mother.  ROS:   Please see the history of present illness.    All other systems reviewed and are negative.  EKGs/Labs/Other Studies Reviewed:    The following studies were reviewed today: EKG done today revealed atrial flutter flutter with well-controlled ventricular rate.   Recent Labs: 03/18/2017: ALT 18; TSH 15.120  Recent Lipid Panel No results found for: CHOL, TRIG, HDL, CHOLHDL, VLDL, LDLCALC,  LDLDIRECT  Physical Exam:    VS:  BP 130/70 (BP Location: Right Arm, Patient Position: Sitting, Cuff Size: Normal)   Pulse 81   Ht 5\' 9"  (1.753 m)   Wt 252 lb (114.3 kg)   SpO2 98%   BMI 37.21 kg/m     Wt Readings from Last 3 Encounters:  07/23/17 252 lb (114.3 kg)  03/18/17 261 lb (118.4 kg)  01/21/17 259 lb 12.8 oz (117.8 kg)     GEN: Patient is in no acute distress HEENT: Normal NECK: No JVD; No carotid bruits LYMPHATICS: No lymphadenopathy CARDIAC: Hear sounds regular, 2/6 systolic murmur at the apex. RESPIRATORY:  Clear to auscultation without rales, wheezing or rhonchi  ABDOMEN: Soft, non-tender, non-distended MUSCULOSKELETAL:  No edema; No deformity  SKIN: Warm and dry NEUROLOGIC:  Alert and oriented x 3 PSYCHIATRIC:  Normal affect   Signed, Garwin Brothersajan R Yobana Culliton, MD  07/23/2017 5:04 PM    [facility]

## 2017-07-23 NOTE — Patient Instructions (Signed)
Medication Instructions:  Your physician recommends that you continue on your current medications as directed. Please refer to the Current Medication list given to you today.  Labwork: None  Testing/Procedures: None  Follow-Up: Your physician recommends that you schedule a follow-up appointment in: 6 months  Any Other Special Instructions Will Be Listed Below (If Applicable).   Referral Orders     Ambulatory referral to Cardiac Electrophysiology Dr. Jenel LucksAllred's office should be calling you to schedule an appointment; please call the office if you do not hear from them.    If you need a refill on your cardiac medications before your next appointment, please call your pharmacy.   [facility]  Garey HamAshley A, RN, BSN

## 2017-08-17 ENCOUNTER — Telehealth: Payer: Self-pay | Admitting: *Deleted

## 2017-08-17 NOTE — Telephone Encounter (Signed)
Pt phoned inquiring about referral. Referral put in for Dr. Johney Frame on 07/23/17 and still hasn't heard. I gave pt name of cardiologist and number for him to call them.

## 2017-09-10 ENCOUNTER — Other Ambulatory Visit: Payer: Medicare PPO

## 2017-09-10 ENCOUNTER — Ambulatory Visit: Payer: Medicare PPO | Admitting: Cardiology

## 2017-09-10 ENCOUNTER — Encounter: Payer: Self-pay | Admitting: Cardiology

## 2017-09-10 VITALS — BP 104/70 | HR 59 | Ht 69.0 in | Wt 246.8 lb

## 2017-09-10 DIAGNOSIS — I480 Paroxysmal atrial fibrillation: Secondary | ICD-10-CM | POA: Diagnosis not present

## 2017-09-10 DIAGNOSIS — Z01812 Encounter for preprocedural laboratory examination: Secondary | ICD-10-CM

## 2017-09-10 NOTE — Progress Notes (Signed)
Electrophysiology Office Note   Date:  09/10/2017   ID:  RICHAR Mccullough, DOB Jul 27, 1950, MRN [id number]  PCP:  [name], NP  Cardiologist:  [name] Primary Electrophysiologist:  [name], MD    Chief Complaint  Patient presents with  . Follow-up    PAF     History of Present Illness: Jesus Mccullough is a 67 y.o. male who is being seen today for the evaluation of atrial fibrillation at the request of [name], Aundra Dubin, MD. Presenting today for electrophysiology evaluation. He has a history of paroxysmal atrial fibrillation, diabetes, hypertension, and hyperlipidemia. He had ablation for atrial fibrillation at [facility]. He returned to clinic in October 2018 and was found to be in recurrent atrial fibrillation. He had cryoablation on 11/25/16.  He returned to clinic on 07/23/2017 and was in atrial flutter.  His main complaint today is of weakness, fatigue, and shortness of breath.  He has had hospitalizations in the past for tachycardia as well as hypotension.  He is interested in repeat ablation.  Today, denies symptoms of palpitations, chest pain, shortness of breath, orthopnea, PND, lower extremity edema, claudication, dizziness, presyncope, syncope, bleeding, or neurologic sequela. The patient is tolerating medications without difficulties.     Past Medical History:  Diagnosis Date  . Arrhythmia   . Diabetes mellitus due to underlying condition with unspecified complications (HCC) 12/16/2016  . Essential hypertension 12/16/2016  . Mixed dyslipidemia 12/16/2016  . Overweight 12/16/2016  . PAF (paroxysmal atrial fibrillation) (HCC) 12/16/2016   No past surgical history on file.   Current Outpatient Medications  Medication Sig Dispense Refill  . allopurinol (ZYLOPRIM) 300 MG tablet Take 300 mg by mouth daily.    Marland Kitchen amiodarone (PACERONE) 200 MG tablet Take 200 mg by mouth daily.    Marland Kitchen atorvastatin (LIPITOR) 10 MG tablet     . Cholecalciferol (VITAMIN D3) 1000  units CAPS Take 1 capsule by mouth daily.    . empagliflozin (JARDIANCE) 25 MG TABS tablet Take 25 mg by mouth daily.    Marland Kitchen gabapentin (NEURONTIN) 600 MG tablet Take 1,200 mg by mouth 3 (three) times daily.    Marland Kitchen glipiZIDE (GLUCOTROL) 10 MG tablet Take 10 mg by mouth daily.    Marland Kitchen levothyroxine (SYNTHROID, LEVOTHROID) 50 MCG tablet Take 50 mcg by mouth daily.  3  . lisinopril (PRINIVIL,ZESTRIL) 5 MG tablet     . metFORMIN (GLUCOPHAGE) 500 MG tablet Take 1,000 mg by mouth 2 (two) times daily.    . metoprolol tartrate (LOPRESSOR) 50 MG tablet Take 25 mg by mouth 2 (two) times daily.    . Omega-3 Fatty Acids (FISH OIL) 1000 MG CAPS Take 1 capsule by mouth daily.    Marland Kitchen omeprazole (PRILOSEC) 40 MG capsule Take 40 mg by mouth daily.    Marland Kitchen POTASSIUM PO Take 1 tablet by mouth daily.    . tamsulosin (FLOMAX) 0.4 MG CAPS capsule Take 0.4 mg by mouth daily.    Marland Kitchen testosterone cypionate (DEPOTESTOSTERONE CYPIONATE) 200 MG/ML injection Inject into the muscle every 21 ( twenty-one) days.    [name] 20 MG TABS tablet Take 20 mg by mouth daily.     No current facility-administered medications for this visit.     Allergies:   Antihistamines, chlorpheniramine-type; Aspirin; and Nyquil multi-symptom  [pseudoeph-doxylamine-dm-apap]   Social History:  The patient  reports that he has never smoked. He has never used smokeless tobacco. He reports that he does not drink alcohol or use  drugs.   Family History:  The patient's family history includes Heart attack in his maternal uncle; Heart disease in his maternal uncle and mother.   ROS:  Please see the history of present illness.   Otherwise, review of systems is positive for palpitations.   All other systems are reviewed and negative.   PHYSICAL EXAM: VS:  BP 104/70   Pulse (!) 59   Ht  (1.753 m)   Wt 246 lb 12.8 oz (111.9 kg)   SpO2 91%   BMI 36.45 kg/m  , BMI Body mass index is 36.45 kg/m. GEN: Well nourished, well developed, in no acute distress    HEENT: normal  Neck: no JVD, carotid bruits, or masses Cardiac: RRR; no murmurs, rubs, or gallops,no edema  Respiratory:  clear to auscultation bilaterally, normal work of breathing GI: soft, nontender, nondistended, + BS MS: no deformity or atrophy  Skin: warm and dry Neuro:  Strength and sensation are intact Psych: euthymic mood, full affect  EKG:  EKG is not ordered today. Personal review of the ekg ordered 07/23/17 shows atrial flutter, rate 83, septal MI, IMI  Recent Labs: 03/18/2017: ALT 18; TSH 15.120  Lipid Panel  No results found for: CHOL, TRIG, HDL, CHOLHDL, VLDL, LDLCALC, LDLDIRECT   Wt Readings from Last 3 Encounters:  09/10/17 246 lb 12.8 oz (111.9 kg)  07/23/17 252 lb (114.3 kg)  03/18/17 261 lb (118.4 kg)      Other studies Reviewed: Additional studies/ records that were reviewed today include: TEE 11/21/16 Review of the above records today demonstrates:  Normal mitral valve structure and mobility. Mild mitral regurgitation. Aortic Valve Normal tricuspid aortic valve with pliable leaflets, no stenosis or insufficiency. Tricuspid Valve Structurally normal tricuspid valve with no stenosis. Pulmonic Valve Normal pulmonic valve structure and mobility. Left Atrium Moderately dilated left atrium. Left Ventricle Normal left ventricle size and systolic function, with no segmental abnormality. Right Atrium Normal right atrium. Right Ventricle Normal right ventricle structure and function. Pericardial Effusion No evidence of pericardial effusion. Pleural Effusion No evidence of pleural effusion. Miscellaneous The aorta is within normal limits. IVC is normal and collapses   ASSESSMENT AND PLAN:  1.  Paroxysmal atrial fibrillation: Cryoablation 11/23/2016.  Currently on amiodarone and Xarelto.  He had early return of atrial fibrillation.  He also returned in atrial flutter.  He is now interested in repeat ablation.  Risks and benefits were discussed and  include bleeding, tamponade, heart block, stroke, damage to surrounding organs.  He understands these risks and is agreed to the procedure.    This patients CHA2DS2-VASc Score and unadjusted Ischemic Stroke Rate (% per year) is equal to 2.2 % stroke rate/year from a score of 2  Above score calculated as 1 point each if present [CHF, HTN, DM, Vascular=MI/PAD/Aortic Plaque, Age if 65-74, or Male] Above score calculated as 2 points each if present [Age > 75, or Stroke/TIA/TE]   2. Hypertension: Controlled today.  No changes.  3. Hyperlipidemia: Per primary cardiology, on Lipitor   Current medicines are reviewed at length with the patient today.   The patient does not have concerns regarding his medicines.  The following changes were made today:  none  Labs/ tests ordered today include:  Orders Placed This Encounter  Procedures  . CT CARDIAC MORPH/PULM VEIN W/CM&W/O CA SCORE  . CT CORONARY FRACTIONAL FLOW RESERVE DATA PREP  . CT CORONARY FRACTIONAL FLOW RESERVE FLUID ANALYSIS  . Basic Metabolic Panel (BMET)  . CBC w/Diff  Case discussed with primary cardiology  Disposition:   FU with Lailana Shira 3 months  Signed, Sylvie Mifsud Jorja Loa, MD  09/10/2017 3:35 PM     [facility] [address] [place] [address] [phone] (office) [phone] (fax)

## 2017-09-10 NOTE — Patient Instructions (Addendum)
Medication Instructions:  Your physician recommends that you continue on your current medications as directed. Please refer to the Current Medication list given to you today.  Labwork: Pre procedure lab work today: BMET and CBC     *We will only notify you of abnormal results, otherwise continue current treatment plan.  Testing/Procedures: Your physician has requested that you have cardiac CT - you will have this test within 7 days PRIOR to your ablation. Cardiac computed tomography (CT) is a painless test that uses an x-ray machine to take clear, detailed pictures of your heart. For further information please visit [email or web address] Please follow instruction sheet as given.  Your physician has recommended that you have an ablation. Catheter ablation is a medical procedure used to treat some cardiac arrhythmias (irregular heartbeats). During catheter ablation, a long, thin, flexible tube is put into a blood vessel in your groin (upper thigh), or neck. This tube is called an ablation catheter. It is then guided to your heart through the blood vessel. Radio frequency waves destroy small areas of heart tissue where abnormal heartbeats may cause an arrhythmia to start.   Instructions for your ablation: 1. Please arrive at the [facility], Main Entrance "A", of [facility] at 5:30 a.m. on 10/01/2017. 2. Do not eat or drink after midnight the night prior to the procedure. 3. Do not miss any doses of XARELTO prior to the morning of the procedure.  4. Do not take any medications the morning of the procedure. 5. You will shaved for this procedure (if needed). Typically both groins, chest and back  are shaven. We ask that you shave these areas yourself at home 1-2 days prior  to the procedure. If you are uncomfortable/unable, then hospital staff will shave  these areas the morning of your procedure. 6. Plan for an overnight stay in the hospital. 7. You will need someone to drive you home at  discharge.   Follow-Up: Your physician recommends that you schedule a follow-up appointment in: 4 weeks, after your procedure on 10/01/2017, with Rudi Coco in the AFib clinic.  Your physician recommends that you schedule a follow-up appointment in: 3 months, after your procedure on 10/01/2017, with Dr. Elberta Fortis.   * If you need a refill on your cardiac medications before your next appointment, please call your pharmacy.   *Please note that any paperwork needing to be filled out by the provider will need to be addressed at the front desk prior to seeing the provider. Please note that any FMLA, disability or other documents regarding health condition is subject to a $25.00 charge that must be received prior to completion of paperwork in the form of a money order or check.  Thank you for choosing [facility]!!   Dory Horn, RN [phone]  Any Other Special Instructions Will Be Listed Below (If Applicable).  CT INSTRUCTIONS Please arrive at the [facility] main entrance of [facility] at xx:xx AM (30-45 minutes prior to test start time)  [facility] [address] [place], [place] [address] [phone]  Proceed to the [facility] Radiology Department (First Floor).  Please follow these instructions carefully (unless otherwise directed):  Hold all erectile dysfunction medications at least 48 hours prior to test.  On the Night Before the Test: . Drink plenty of water. . Do not consume any caffeinated/decaffeinated beverages or chocolate 12 hours prior to your test. . Do not take any antihistamines 12 hours prior to your test. . If you  take Metformin do not take 24 hours prior to test. . If the patient has contrast allergy: ? Patient will need a prescription for Prednisone and very clear instructions (as follows): 1. Prednisone 50 mg - take 13 hours prior to test 2. Take another Prednisone 50 mg 7 hours prior to test 3. Take another Prednisone 50  mg 1 hour prior to test 4. Take Benadryl 50 mg 1 hour prior to test . Patient must complete all four doses of above prophylactic medications. . Patient will need a ride after test due to Benadryl.  On the Day of the Test: . Drink plenty of water. Do not drink any water within one hour of the test. . Do not eat any food 4 hours prior to the test. . You may take your regular medications prior to the test. . MAKE SURE TO TAKE YOUR LOPRESSOR THE MORNING OF THIS TEST . HOLD Furosemide morning of the test.  After the Test: . Drink plenty of water. . After receiving IV contrast, you may experience a mild flushed feeling. This is normal. . On occasion, you may experience a mild rash up to 24 hours after the test. This is not dangerous. If this occurs, you can take Benadryl 25 mg and increase your fluid intake. . If you experience trouble breathing, this can be serious. If it is severe call 911 IMMEDIATELY. If it is mild, please call our office. . If you take any of these medications: Glipizide/Metformin, Avandament, Glucavance, please do not take 48 hours after completing test.   Cardiac Ablation Cardiac ablation is a procedure to disable (ablate) a small amount of heart tissue in very specific places. The heart has many electrical connections. Sometimes these connections are abnormal and can cause the heart to beat very fast or irregularly. Ablating some of the problem areas can improve the heart rhythm or return it to normal. Ablation may be done for people who:  Have Wolff-Parkinson-White syndrome.  Have fast heart rhythms (tachycardia).  Have taken medicines for an abnormal heart rhythm (arrhythmia) that were not effective or caused side effects.  Have a high-risk heartbeat that may be life-threatening.  During the procedure, a small incision is made in the neck or the groin, and a long, thin, flexible tube (catheter) is inserted into the incision and moved to the heart. Small devices  (electrodes) on the tip of the catheter will send out electrical currents. A type of X-ray (fluoroscopy) will be used to help guide the catheter and to provide images of the heart. Tell a health care provider about:  Any allergies you have.  All medicines you are taking, including vitamins, herbs, eye drops, creams, and over-the-counter medicines.  Any problems you or family members have had with anesthetic medicines.  Any blood disorders you have.  Any surgeries you have had.  Any medical conditions you have, such as kidney failure.  Whether you are pregnant or may be pregnant. What are the risks? Generally, this is a safe procedure. However, problems may occur, including:  Infection.  Bruising and bleeding at the catheter insertion site.  Bleeding into the chest, especially into the sac that surrounds the heart. This is a serious complication.  Stroke or blood clots.  Damage to other structures or organs.  Allergic reaction to medicines or dyes.  Need for a permanent pacemaker if the normal electrical system is damaged. A pacemaker is a small computer that sends electrical signals to the heart and helps your heart beat normally.  The procedure not being fully effective. This may not be recognized until months later. Repeat ablation procedures are sometimes required.  What happens before the procedure?  Follow instructions from your health care provider about eating or drinking restrictions.  Ask your health care provider about: ? Changing or stopping your regular medicines. This is especially important if you are taking diabetes medicines or blood thinners. ? Taking medicines such as aspirin and ibuprofen. These medicines can thin your blood. Do not take these medicines before your procedure if your health care provider instructs you not to.  Plan to have someone take you home from the hospital or clinic.  If you will be going home right after the procedure, plan to  have someone with you for 24 hours. What happens during the procedure?  To lower your risk of infection: ? Your health care team will wash or sanitize their hands. ? Your skin will be washed with soap. ? Hair may be removed from the incision area.  An IV tube will be inserted into one of your veins.  You will be given a medicine to help you relax (sedative).  The skin on your neck or groin will be numbed.  An incision will be made in your neck or your groin.  A needle will be inserted through the incision and into a large vein in your neck or groin.  A catheter will be inserted into the needle and moved to your heart.  Dye may be injected through the catheter to help your surgeon see the area of the heart that needs treatment.  Electrical currents will be sent from the catheter to ablate heart tissue in desired areas. There are three types of energy that may be used to ablate heart tissue: ? Heat (radiofrequency energy). ? Laser energy. ? Extreme cold (cryoablation).  When the necessary tissue has been ablated, the catheter will be removed.  Pressure will be held on the catheter insertion area to prevent excessive bleeding.  A bandage (dressing) will be placed over the catheter insertion area. The procedure may vary among health care providers and hospitals. What happens after the procedure?  Your blood pressure, heart rate, breathing rate, and blood oxygen level will be monitored until the medicines you were given have worn off.  Your catheter insertion area will be monitored for bleeding. You will need to lie still for a few hours to ensure that you do not bleed from the catheter insertion area.  Do not drive for 24 hours or as long as directed by your health care provider. Summary  Cardiac ablation is a procedure to disable (ablate) a small amount of heart tissue in very specific places. Ablating some of the problem areas can improve the heart rhythm or return it to  normal.  During the procedure, electrical currents will be sent from the catheter to ablate heart tissue in desired areas. This information is not intended to replace advice given to you by your health care provider. Make sure you discuss any questions you have with your health care provider. Document Released: 08/17/2008 Document Revised: 02/18/2016 Document Reviewed: 02/18/2016 Elsevier Interactive Patient Education  [employer].

## 2017-09-11 ENCOUNTER — Other Ambulatory Visit: Payer: Self-pay

## 2017-09-11 DIAGNOSIS — I480 Paroxysmal atrial fibrillation: Secondary | ICD-10-CM

## 2017-09-11 LAB — BASIC METABOLIC PANEL
BUN / CREAT RATIO: 16 (ref 10–24)
BUN: 17 mg/dL (ref 8–27)
CHLORIDE: 102 mmol/L (ref 96–106)
CO2: 21 mmol/L (ref 20–29)
CREATININE: 1.04 mg/dL (ref 0.76–1.27)
Calcium: 9.5 mg/dL (ref 8.6–10.2)
GFR calc Af Amer: 85 mL/min/{1.73_m2} (ref >59)
GFR calc non Af Amer: 74 mL/min/{1.73_m2} (ref >59)
GLUCOSE: 77 mg/dL (ref 65–99)
Potassium: 4.4 mmol/L (ref 3.5–5.2)
SODIUM: 139 mmol/L (ref 134–144)

## 2017-09-11 LAB — CBC WITH DIFFERENTIAL/PLATELET

## 2017-09-14 ENCOUNTER — Other Ambulatory Visit: Payer: Medicare PPO

## 2017-09-15 LAB — CBC WITH DIFFERENTIAL/PLATELET
BASOS: 0 %
Basophils Absolute: 0 10*3/uL (ref 0.0–0.2)
EOS (ABSOLUTE): 0.2 10*3/uL (ref 0.0–0.4)
Eos: 2 %
HEMOGLOBIN: 14.2 g/dL (ref 13.0–17.7)
Hematocrit: 41.3 % (ref 37.5–51.0)
IMMATURE GRANS (ABS): 0 10*3/uL (ref 0.0–0.1)
Immature Granulocytes: 0 %
LYMPHS ABS: 2.4 10*3/uL (ref 0.7–3.1)
Lymphs: 27 %
MCH: 31.7 pg (ref 26.6–33.0)
MCHC: 34.4 g/dL (ref 31.5–35.7)
MCV: 92 fL (ref 79–97)
MONOCYTES: 10 %
Monocytes Absolute: 0.9 10*3/uL (ref 0.1–0.9)
NEUTROS ABS: 5.4 10*3/uL (ref 1.4–7.0)
Neutrophils: 61 %
Platelets: 286 10*3/uL (ref 150–450)
RBC: 4.48 x10E6/uL (ref 4.14–5.80)
RDW: 14.5 % (ref 12.3–15.4)
WBC: 9 10*3/uL (ref 3.4–10.8)

## 2017-09-24 ENCOUNTER — Telehealth: Payer: Self-pay | Admitting: Cardiology

## 2017-09-24 NOTE — Telephone Encounter (Signed)
Rescheduling ablation per pt request. Rescheduled to 7/23. Pt will come to office on 7/2 for H&P and pre procedure labs.  He understands we will go over instructions at that appt. He is agreeable to above stated plan.

## 2017-09-24 NOTE — Telephone Encounter (Signed)
New message    Pt is calling about rescheduling ablation. Please call.

## 2017-09-29 ENCOUNTER — Ambulatory Visit (HOSPITAL_COMMUNITY): Payer: Medicare PPO

## 2017-10-13 ENCOUNTER — Encounter: Payer: Self-pay | Admitting: Cardiology

## 2017-10-13 ENCOUNTER — Ambulatory Visit: Payer: Medicare HMO | Admitting: Cardiology

## 2017-10-13 VITALS — BP 132/76 | HR 56 | Ht 69.0 in | Wt 252.4 lb

## 2017-10-13 DIAGNOSIS — I10 Essential (primary) hypertension: Secondary | ICD-10-CM

## 2017-10-13 DIAGNOSIS — E785 Hyperlipidemia, unspecified: Secondary | ICD-10-CM | POA: Diagnosis not present

## 2017-10-13 DIAGNOSIS — Z79899 Other long term (current) drug therapy: Secondary | ICD-10-CM

## 2017-10-13 DIAGNOSIS — I480 Paroxysmal atrial fibrillation: Secondary | ICD-10-CM

## 2017-10-13 DIAGNOSIS — Z01812 Encounter for preprocedural laboratory examination: Secondary | ICD-10-CM

## 2017-10-13 NOTE — Progress Notes (Signed)
Electrophysiology Office Note   Date:  10/13/2017   ID:  Jesus MylarSidney R Manville, DOB [birth year], MRN [id number]  PCP:  [name], [name], NP  Cardiologist:  [name] Primary Electrophysiologist:  Regan LemmingWill Martin Rainelle Sulewski, MD    Chief Complaint  Patient presents with  . Follow-up    PAF/H&P and pre procedure labs     History of Present Illness: Jesus Mccullough is a 67 y.o. male who is being seen today for the evaluation of atrial fibrillation at the request of [name], [name], NP. Presenting today for electrophysiology evaluation. He has a history of paroxysmal atrial fibrillation, diabetes, hypertension, and hyperlipidemia. He had ablation for atrial [facility]. He returned to clinic in October 2018 and was found to be in recurrent atrial fibrillation. He had cryoablation on 11/25/16.  He returned to clinic on 07/23/2017 and was in atrial flutter.  He is plan for repeat atrial fibrillation ablation 11/03/2017.  Today, denies symptoms of palpitations, chest pain, shortness of breath, orthopnea, PND, lower extremity edema, claudication, dizziness, presyncope, syncope, bleeding, or neurologic sequela. The patient is tolerating medications without difficulties.  Overall he is doing well.  He is continued to flop in and out of atrial fibrillation.  Otherwise he has no major complaints.  Past Medical History:  Diagnosis Date  . Arrhythmia   . Diabetes mellitus due to underlying condition with unspecified complications (HCC) 12/16/2016  . Essential hypertension 12/16/2016  . Mixed dyslipidemia 12/16/2016  . Overweight 12/16/2016  . PAF (paroxysmal atrial fibrillation) (HCC) 12/16/2016   History reviewed. No pertinent surgical history.   Current Outpatient Medications  Medication Sig Dispense Refill  . allopurinol (ZYLOPRIM) 300 MG tablet Take 300 mg by mouth daily.    Marland Kitchen. amiodarone (PACERONE) 200 MG tablet Take 200 mg by mouth daily.    Marland Kitchen. atorvastatin (LIPITOR) 10 MG tablet     .  Cholecalciferol (VITAMIN D3) 1000 units CAPS Take 1 capsule by mouth daily.    . empagliflozin (JARDIANCE) 25 MG TABS tablet Take 25 mg by mouth daily.    Marland Kitchen. gabapentin (NEURONTIN) 600 MG tablet Take 1,200 mg by mouth 3 (three) times daily.    Marland Kitchen. glipiZIDE (GLUCOTROL) 10 MG tablet Take 10 mg by mouth daily.    Marland Kitchen. levothyroxine (SYNTHROID, LEVOTHROID) 50 MCG tablet Take 50 mcg by mouth daily.  3  . lisinopril (PRINIVIL,ZESTRIL) 5 MG tablet     . metFORMIN (GLUCOPHAGE) 500 MG tablet Take 1,000 mg by mouth 2 (two) times daily.    . metoprolol tartrate (LOPRESSOR) 50 MG tablet Take 25 mg by mouth 2 (two) times daily.    . Omega-3 Fatty Acids (FISH OIL) 1000 MG CAPS Take 1 capsule by mouth daily.    Marland Kitchen. omeprazole (PRILOSEC) 40 MG capsule Take 40 mg by mouth daily.    Marland Kitchen. POTASSIUM PO Take 1 tablet by mouth daily.    . tamsulosin (FLOMAX) 0.4 MG CAPS capsule Take 0.4 mg by mouth daily.    Marland Kitchen. testosterone cypionate (DEPOTESTOSTERONE CYPIONATE) 200 MG/ML injection Inject into the muscle every 21 ( twenty-one) days.    [name]. XARELTO 20 MG TABS tablet Take 20 mg by mouth daily.     No current facility-administered medications for this visit.     Allergies:   Antihistamines, chlorpheniramine-type; Aspirin; and Nyquil multi-symptom  [pseudoeph-doxylamine-dm-apap]   Social History:  The patient  reports that he has never smoked. He has never used smokeless tobacco. He reports that he does not drink alcohol  or use drugs.   Family History:  The patient's family history includes Heart attack in his maternal uncle; Heart disease in his maternal uncle and mother.   ROS:  Please see the history of present illness.   Otherwise, review of systems is positive for none.   All other systems are reviewed and negative.   PHYSICAL EXAM: VS:  BP 132/76   Pulse (!) 56   Ht 5\' 9"  (1.753 m)   Wt 252 lb 6.4 oz (114.5 kg)   SpO2 97%   BMI 37.27 kg/m  , BMI Body mass index is 37.27 kg/m. GEN: Well nourished, well developed,  in no acute distress  HEENT: normal  Neck: no JVD, carotid bruits, or masses Cardiac: RRR; no murmurs, rubs, or gallops,no edema  Respiratory:  clear to auscultation bilaterally, normal work of breathing GI: soft, nontender, nondistended, + BS MS: no deformity or atrophy  Skin: warm and dry Neuro:  Strength and sensation are intact Psych: euthymic mood, full affect  EKG:  EKG is not ordered today. Personal review of the ekg ordered 07/23/17 shows atrial flutter, rate 83   Recent Labs: 03/18/2017: ALT 18; TSH 15.120 09/10/2017: BUN 17; Creatinine, Ser 1.04; Potassium 4.4; Sodium 139 09/14/2017: Hemoglobin 14.2; Platelets 286  Lipid Panel  No results found for: CHOL, TRIG, HDL, CHOLHDL, VLDL, LDLCALC, LDLDIRECT   Wt Readings from Last 3 Encounters:  10/13/17 252 lb 6.4 oz (114.5 kg)  09/10/17 246 lb 12.8 oz (111.9 kg)  07/23/17 252 lb (114.3 kg)      Other studies Reviewed: Additional studies/ records that were reviewed today include: TEE 11/21/16 Review of the above records today demonstrates:  Normal mitral valve structure and mobility. Mild mitral regurgitation. Aortic Valve Normal tricuspid aortic valve with pliable leaflets, no stenosis or insufficiency. Tricuspid Valve Structurally normal tricuspid valve with no stenosis. Pulmonic Valve Normal pulmonic valve structure and mobility. Left Atrium Moderately dilated left atrium. Left Ventricle Normal left ventricle size and systolic function, with no segmental abnormality. Right Atrium Normal right atrium. Right Ventricle Normal right ventricle structure and function. Pericardial Effusion No evidence of pericardial effusion. Pleural Effusion No evidence of pleural effusion. Miscellaneous The aorta is within normal limits. IVC is normal and collapses   ASSESSMENT AND PLAN:  1.  Paroxysmal atrial fibrillation: This post cryoablation 11/23/2016.  Currently on amiodarone and Xarelto.  Plan for repeat ablation  11/03/2017.  Benefits previously discussed.  Patient has no questions.  No changes.  This patients CHA2DS2-VASc Score and unadjusted Ischemic Stroke Rate (% per year) is equal to 2.2 % stroke rate/year from a score of 2  Above score calculated as 1 point each if present [CHF, HTN, DM, Vascular=MI/PAD/Aortic Plaque, Age if 65-74, or Male] Above score calculated as 2 points each if present [Age > 75, or Stroke/TIA/TE]  2. Hypertension:  Well-controlled today.  No changes.  3. Hyperlipidemia: On Lipitor per primary cardiology  Current medicines are reviewed at length with the patient today.   The patient does not have concerns regarding his medicines.  The following changes were made today: None  Labs/ tests ordered today include:  No orders of the defined types were placed in this encounter.   Disposition:   FU with Aavya Shafer 3 months  Signed, Jolyssa Oplinger Jorja Loa, MD  10/13/2017 11:57 AM     [facility] [address] [place] [address] [phone] (office) [phone] (fax)

## 2017-10-13 NOTE — Patient Instructions (Addendum)
Medication Instructions:  Your physician recommends that you continue on your current medications as directed. Please refer to the Current Medication list given to you today.  Labwork: Pre procedure lab work today: BMET and CBC     *We will only notify you of abnormal results, otherwise continue current treatment plan.  Testing/Procedures: Your physician has requested that you have cardiac CT - you will have this test within 7 days PRIOR to your ablation. Cardiac computed tomography (CT) is a painless test that uses an x-ray machine to take clear, detailed pictures of your heart. For further information please visit [email or web address] Please follow instruction sheet as given.  Your physician has recommended that you have an ablation. Catheter ablation is a medical procedure used to treat some cardiac arrhythmias (irregular heartbeats). During catheter ablation, a long, thin, flexible tube is put into a blood vessel in your groin (upper thigh), or neck. This tube is called an ablation catheter. It is then guided to your heart through the blood vessel. Radio frequency waves destroy small areas of heart tissue where abnormal heartbeats may cause an arrhythmia to start.   Instructions for your ablation: 1. Please arrive at the [facility], Main Entrance "A", of [facility] at 5:30 a.m. on 11/03/2017. 2. Do not eat or drink after midnight the night prior to the procedure. 3. Do not miss any doses of XARELTO prior to the morning of the procedure.  4. Do not take any medications the morning of the procedure. 5. You will shaved for this procedure (if needed). Typically both groins, chest and back     are shaven. We ask that you shave these areas yourself at home 1-2 days prior         to the procedure. If you are uncomfortable/unable, then hospital staff will shave  these areas the morning of your procedure. 6. Plan for an overnight stay in the hospital. 7. You will need someone to drive you  home at discharge.   Follow-Up: Your physician recommends that you schedule a follow-up appointment in: 4 weeks, after your procedure on 11/03/2017, with Rudi Coco in the AFib clinic.  Your physician recommends that you schedule a follow-up appointment in: 3 months, after your procedure on 11/03/2017, with Dr. Elberta Fortis.   * If you need a refill on your cardiac medications before your next appointment, please call your pharmacy.   *Please note that any paperwork needing to be filled out by the provider will need to be addressed at the front desk prior to seeing the provider. Please note that any FMLA, disability or other documents regarding health condition is subject to a $25.00 charge that must be received prior to completion of paperwork in the form of a money order or check.  Thank you for choosing [facility]!!   Dory Horn, RN [phone]  Any Other Special Instructions Will Be Listed Below (If Applicable).  CT INSTRUCTIONS Please arrive at the [facility] main entrance of [facility] at ______ AM (30-45 minutes prior to test start time)  [facility] [address] [place], [place] [address] [phone]  Proceed to the [facility] Radiology Department (First Floor).  Please follow these instructions carefully (unless otherwise directed):  Hold all erectile dysfunction medications at least 48 hours prior to test.  On the Night Before the Test:  Drink plenty of water.  Do not consume any caffeinated/decaffeinated beverages or chocolate 12 hours prior to your test.  Do not take any  antihistamines 12 hours prior to your test.  If you take Metformin do not take 24 hours prior to test.  If the patient has contrast allergy: ? Patient will need a prescription for Prednisone and very clear instructions (as follows): 1. Prednisone 50 mg - take 13 hours prior to test 2. Take another Prednisone 50 mg 7 hours prior to test 3. Take  another Prednisone 50 mg 1 hour prior to test 4. Take Benadryl 50 mg 1 hour prior to test  Patient must complete all four doses of above prophylactic medications.  Patient will need a ride after test due to Benadryl.  On the Day of the Test:  Drink plenty of water. Do not drink any water within one hour of the test.  Do not eat any food 4 hours prior to the test.  You may take your regular medications prior to the test.  MAKE SURE TO TAKE YOUR LOPRESSOR THE MORNING OF THIS TEST  HOLD Furosemide morning of the test.  After the Test:  Drink plenty of water.  After receiving IV contrast, you may experience a mild flushed feeling. This is normal.  On occasion, you may experience a mild rash up to 24 hours after the test. This is not dangerous. If this occurs, you can take Benadryl 25 mg and increase your fluid intake.  If you experience trouble breathing, this can be serious. If it is severe call 911 IMMEDIATELY. If it is mild, please call our office.  If you take any of these medications: Glipizide/Metformin, Avandament, Glucavance, please do not take 48 hours after completing test.   Cardiac Ablation Cardiac ablation is a procedure to disable (ablate) a small amount of heart tissue in very specific places. The heart has many electrical connections. Sometimes these connections are abnormal and can cause the heart to beat very fast or irregularly. Ablating some of the problem areas can improve the heart rhythm or return it to normal. Ablation may be done for people who:  Have Wolff-Parkinson-White syndrome.  Have fast heart rhythms (tachycardia).  Have taken medicines for an abnormal heart rhythm (arrhythmia) that were not effective or caused side effects.  Have a high-risk heartbeat that may be life-threatening.   During the procedure, a small incision is made in the neck or the groin, and a long, thin, flexible tube (catheter) is inserted into the incision and moved to  the heart. Small devices (electrodes) on the tip of the catheter will send out electrical currents. A type of X-ray (fluoroscopy) will be used to help guide the catheter and to provide images of the heart. Tell a health care provider about:  Any allergies you have.  All medicines you are taking, including vitamins, herbs, eye drops, creams, and over-the-counter medicines.  Any problems you or family members have had with anesthetic medicines.  Any blood disorders you have.  Any surgeries you have had.  Any medical conditions you have, such as kidney failure.  Whether you are pregnant or may be pregnant. What are the risks? Generally, this is a safe procedure. However, problems may occur, including:  Infection.  Bruising and bleeding at the catheter insertion site.  Bleeding into the chest, especially into the sac that surrounds the heart. This is a serious complication.  Stroke or blood clots.  Damage to other structures or organs.  Allergic reaction to medicines or dyes.  Need for a permanent pacemaker if the normal electrical system is damaged. A pacemaker is a small computer that sends  electrical signals to the heart and helps your heart beat normally.  The procedure not being fully effective. This may not be recognized until months later. Repeat ablation procedures are sometimes required.  What happens before the procedure?  Follow instructions from your health care provider about eating or drinking restrictions.  Ask your health care provider about: ? Changing or stopping your regular medicines. This is especially important if you are taking diabetes medicines or blood thinners. ? Taking medicines such as aspirin and ibuprofen. These medicines can thin your blood. Do not take these medicines before your procedure if your health care provider instructs you not to.  Plan to have someone take you home from the hospital or clinic.  If you will be going home right after  the procedure, plan to have someone with you for 24 hours. What happens during the procedure?  To lower your risk of infection: ? Your health care team will wash or sanitize their hands. ? Your skin will be washed with soap. ? Hair may be removed from the incision area.  An IV tube will be inserted into one of your veins.  You will be given a medicine to help you relax (sedative).  The skin on your neck or groin will be numbed.  An incision will be made in your neck or your groin.  A needle will be inserted through the incision and into a large vein in your neck or groin.  A catheter will be inserted into the needle and moved to your heart.  Dye may be injected through the catheter to help your surgeon see the area of the heart that needs treatment.  Electrical currents will be sent from the catheter to ablate heart tissue in desired areas. There are three types of energy that may be used to ablate heart tissue: ? Heat (radiofrequency energy). ? Laser energy. ? Extreme cold (cryoablation).  When the necessary tissue has been ablated, the catheter will be removed.  Pressure will be held on the catheter insertion area to prevent excessive bleeding.  A bandage (dressing) will be placed over the catheter insertion area. The procedure may vary among health care providers and hospitals. What happens after the procedure?  Your blood pressure, heart rate, breathing rate, and blood oxygen level will be monitored until the medicines you were given have worn off.  Your catheter insertion area will be monitored for bleeding. You will need to lie still for a few hours to ensure that you do not bleed from the catheter insertion area.  Do not drive for 24 hours or as long as directed by your health care provider. Summary  Cardiac ablation is a procedure to disable (ablate) a small amount of heart tissue in very specific places. Ablating some of the problem areas can improve the heart  rhythm or return it to normal.  During the procedure, electrical currents will be sent from the catheter to ablate heart tissue in desired areas. This information is not intended to replace advice given to you by your health care provider. Make sure you discuss any questions you have with your health care provider. Document Released: 08/17/2008 Document Revised: 02/18/2016 Document Reviewed: 02/18/2016 Elsevier Interactive Patient Education  [employer]2018 [employer]vier Inc.

## 2017-10-13 NOTE — Addendum Note (Signed)
Addended by: Baird LyonsPRICE, Carly Sabo L on: 10/13/2017 12:31 PM   Modules accepted: Orders

## 2017-10-14 LAB — BASIC METABOLIC PANEL
BUN/Creatinine Ratio: 15 (ref 10–24)
BUN: 18 mg/dL (ref 8–27)
CALCIUM: 9.7 mg/dL (ref 8.6–10.2)
CO2: 21 mmol/L (ref 20–29)
CREATININE: 1.22 mg/dL (ref 0.76–1.27)
Chloride: 102 mmol/L (ref 96–106)
GFR calc Af Amer: 70 mL/min/{1.73_m2} (ref >59)
GFR calc non Af Amer: 61 mL/min/{1.73_m2} (ref >59)
GLUCOSE: 87 mg/dL (ref 65–99)
Potassium: 4.9 mmol/L (ref 3.5–5.2)
SODIUM: 140 mmol/L (ref 134–144)

## 2017-10-14 LAB — CBC
Hematocrit: 45.3 % (ref 37.5–51.0)
Hemoglobin: 15.5 g/dL (ref 13.0–17.7)
MCH: 31.4 pg (ref 26.6–33.0)
MCHC: 34.2 g/dL (ref 31.5–35.7)
MCV: 92 fL (ref 79–97)
Platelets: 317 10*3/uL (ref 150–450)
RBC: 4.93 x10E6/uL (ref 4.14–5.80)
RDW: 14.5 % (ref 12.3–15.4)
WBC: 8.1 10*3/uL (ref 3.4–10.8)

## 2017-10-14 LAB — HEPATIC FUNCTION PANEL
ALT: 21 IU/L (ref 0–44)
AST: 22 IU/L (ref 0–40)
Albumin: 4.8 g/dL (ref 3.6–4.8)
Alkaline Phosphatase: 64 IU/L (ref 39–117)
BILIRUBIN TOTAL: 0.4 mg/dL (ref 0.0–1.2)
BILIRUBIN, DIRECT: 0.12 mg/dL (ref 0.00–0.40)
TOTAL PROTEIN: 8 g/dL (ref 6.0–8.5)

## 2017-10-14 LAB — TSH: TSH: 1.58 u[IU]/mL (ref 0.450–4.500)

## 2017-10-27 ENCOUNTER — Ambulatory Visit (HOSPITAL_COMMUNITY): Payer: Medicare HMO

## 2017-10-27 ENCOUNTER — Ambulatory Visit (HOSPITAL_COMMUNITY)
Admission: RE | Admit: 2017-10-27 | Discharge: 2017-10-27 | Disposition: A | Discharge status: Home / Self Care | Payer: Medicare HMO | Source: Ambulatory Visit | Attending: Cardiology | Admitting: Cardiology

## 2017-10-27 DIAGNOSIS — I480 Paroxysmal atrial fibrillation: Secondary | ICD-10-CM

## 2017-10-27 DIAGNOSIS — I4891 Unspecified atrial fibrillation: Secondary | ICD-10-CM | POA: Diagnosis not present

## 2017-10-27 MED ORDER — IOPAMIDOL (ISOVUE-370) INJECTION 76%
100.0000 mL | Freq: Once | INTRAVENOUS | Status: AC | PRN
  Administered 2017-10-27: 100 mL via INTRAVENOUS

## 2017-10-27 MED ORDER — IOPAMIDOL (ISOVUE-370) INJECTION 76%
INTRAVENOUS | Status: AC
  Filled 2017-10-27: qty 100

## 2017-10-29 ENCOUNTER — Ambulatory Visit (HOSPITAL_COMMUNITY): Payer: Medicare PPO | Admitting: Nurse Practitioner

## 2017-11-02 NOTE — Anesthesia Preprocedure Evaluation (Addendum)
Anesthesia Evaluation  Patient identified by MRN, date of birth, ID band Patient awake    Reviewed: Allergy & Precautions, NPO status , Patient's Chart, lab work & pertinent test results, reviewed documented beta blocker date and time   Airway Mallampati: III  TM Distance: >3 FB Neck ROM: Full    Dental no notable dental hx. (+) Teeth Intact, Dental Advisory Given   Pulmonary neg pulmonary ROS,    Pulmonary exam normal breath sounds clear to auscultation       Cardiovascular hypertension, Pt. on medications and Pt. on home beta blockers Normal cardiovascular exam+ dysrhythmias  Rhythm:Regular Rate:Normal  ECG: A-flutter, rate 83  Cardiac CT IMPRESSION: 1. No LAA thrombus 2. Moderate RAE, Mild LAE 3. No ASD/PFO 4. Normal aortic root 3.6 cm 5. No pericardial effusion 6. Coronary Calcium Score 367 which is 73 rd percentile for age and sex 817. 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   Neuro/Psych negative neurological ROS  negative psych ROS   GI/Hepatic Neg liver ROS, GERD  Medicated and Controlled,  Endo/Other  diabetes, Type 2, Oral Hypoglycemic AgentsHypothyroidism   Renal/GU negative Renal ROS     Musculoskeletal negative musculoskeletal ROS (+)   Abdominal (+) + obese,   Peds  Hematology HLD   Anesthesia Other Findings afib  Reproductive/Obstetrics                           Anesthesia Physical Anesthesia Plan  ASA: III  Anesthesia Plan: General   Post-op Pain Management:    Induction: Intravenous  PONV Risk Score and Plan: 2 and Treatment may vary due to age or medical condition, Ondansetron, Dexamethasone and Midazolam  Airway Management Planned: Oral ETT  Additional Equipment:   Intra-op Plan:   Post-operative Plan: Extubation in OR  Informed Consent: I have reviewed the patients History and Physical, chart,  labs and discussed the procedure including the risks, benefits and alternatives for the proposed anesthesia with the patient or authorized representative who has indicated his/her understanding and acceptance.   Dental advisory given  Plan Discussed with: CRNA, Anesthesiologist and Surgeon  Anesthesia Plan Comments:      Anesthesia Quick Evaluation

## 2017-11-03 ENCOUNTER — Ambulatory Visit (HOSPITAL_COMMUNITY): Payer: Medicare HMO

## 2017-11-03 ENCOUNTER — Ambulatory Visit (HOSPITAL_COMMUNITY): Payer: Medicare HMO | Admitting: Anesthesiology

## 2017-11-03 ENCOUNTER — Other Ambulatory Visit: Payer: Self-pay

## 2017-11-03 ENCOUNTER — Ambulatory Visit (HOSPITAL_COMMUNITY)
Admission: RE | Admit: 2017-11-03 | Discharge: 2017-11-04 | Disposition: A | Discharge status: Home / Self Care | Payer: Medicare HMO | Source: Ambulatory Visit | Attending: Cardiology | Admitting: Cardiology

## 2017-11-03 ENCOUNTER — Encounter (HOSPITAL_COMMUNITY)
Admission: RE | Disposition: A | Discharge status: Home / Self Care | Payer: Self-pay | Source: Ambulatory Visit | Attending: Cardiology

## 2017-11-03 ENCOUNTER — Encounter (HOSPITAL_COMMUNITY): Payer: Self-pay | Admitting: Certified Registered Nurse Anesthetist

## 2017-11-03 DIAGNOSIS — K219 Gastro-esophageal reflux disease without esophagitis: Secondary | ICD-10-CM | POA: Insufficient documentation

## 2017-11-03 DIAGNOSIS — I480 Paroxysmal atrial fibrillation: Secondary | ICD-10-CM

## 2017-11-03 DIAGNOSIS — I10 Essential (primary) hypertension: Secondary | ICD-10-CM | POA: Insufficient documentation

## 2017-11-03 DIAGNOSIS — Z7984 Long term (current) use of oral hypoglycemic drugs: Secondary | ICD-10-CM | POA: Insufficient documentation

## 2017-11-03 DIAGNOSIS — E785 Hyperlipidemia, unspecified: Secondary | ICD-10-CM | POA: Diagnosis not present

## 2017-11-03 DIAGNOSIS — E669 Obesity, unspecified: Secondary | ICD-10-CM | POA: Insufficient documentation

## 2017-11-03 DIAGNOSIS — E119 Type 2 diabetes mellitus without complications: Secondary | ICD-10-CM | POA: Insufficient documentation

## 2017-11-03 DIAGNOSIS — I483 Typical atrial flutter: Secondary | ICD-10-CM

## 2017-11-03 DIAGNOSIS — Z7901 Long term (current) use of anticoagulants: Secondary | ICD-10-CM | POA: Insufficient documentation

## 2017-11-03 DIAGNOSIS — Z6836 Body mass index (BMI) 36.0-36.9, adult: Secondary | ICD-10-CM | POA: Insufficient documentation

## 2017-11-03 DIAGNOSIS — I4892 Unspecified atrial flutter: Secondary | ICD-10-CM | POA: Diagnosis not present

## 2017-11-03 HISTORY — DX: Paroxysmal atrial fibrillation: I48.0

## 2017-11-03 HISTORY — PX: ATRIAL FIBRILLATION ABLATION: EP1191

## 2017-11-03 LAB — GLUCOSE, CAPILLARY
GLUCOSE-CAPILLARY: 126 mg/dL — AB (ref 70–99)
GLUCOSE-CAPILLARY: 122 mg/dL — AB (ref 70–99)
Glucose-Capillary: 147 mg/dL — ABNORMAL HIGH (ref 70–99)
Glucose-Capillary: 111 mg/dL — ABNORMAL HIGH (ref 70–99)
Glucose-Capillary: 177 mg/dL — ABNORMAL HIGH (ref 70–99)

## 2017-11-03 LAB — POCT ACTIVATED CLOTTING TIME
ACTIVATED CLOTTING TIME: 257 s
ACTIVATED CLOTTING TIME: 290 s
Activated Clotting Time: 153 seconds

## 2017-11-03 SURGERY — ATRIAL FIBRILLATION ABLATION
Anesthesia: General

## 2017-11-03 MED ORDER — GABAPENTIN 400 MG PO CAPS
800.0000 mg | ORAL_CAPSULE | Freq: Three times a day (TID) | ORAL | Status: DC
  Administered 2017-11-03 (×2): 800 mg via ORAL
  Filled 2017-11-03 (×2): qty 2

## 2017-11-03 MED ORDER — PHENYLEPHRINE 40 MCG/ML (10ML) SYRINGE FOR IV PUSH (FOR BLOOD PRESSURE SUPPORT)
PREFILLED_SYRINGE | INTRAVENOUS | Status: DC | PRN
  Administered 2017-11-03 (×2): 80 ug via INTRAVENOUS

## 2017-11-03 MED ORDER — HEPARIN SODIUM (PORCINE) 1000 UNIT/ML IJ SOLN
INTRAMUSCULAR | Status: AC
  Filled 2017-11-03: qty 1

## 2017-11-03 MED ORDER — ONDANSETRON HCL 4 MG/2ML IJ SOLN
INTRAMUSCULAR | Status: DC | PRN
  Administered 2017-11-03: 4 mg via INTRAVENOUS

## 2017-11-03 MED ORDER — BUPIVACAINE HCL (PF) 0.25 % IJ SOLN
INTRAMUSCULAR | Status: DC | PRN
  Administered 2017-11-03: 30 mL

## 2017-11-03 MED ORDER — SODIUM CHLORIDE 0.9% FLUSH
3.0000 mL | Freq: Two times a day (BID) | INTRAVENOUS | Status: DC
  Administered 2017-11-03: 3 mL via INTRAVENOUS

## 2017-11-03 MED ORDER — HEPARIN (PORCINE) IN NACL 1000-0.9 UT/500ML-% IV SOLN
INTRAVENOUS | Status: DC | PRN
  Administered 2017-11-03 (×5): 500 mL

## 2017-11-03 MED ORDER — SODIUM CHLORIDE 0.9 % IV SOLN
INTRAVENOUS | Status: DC
  Administered 2017-11-03 (×2): via INTRAVENOUS

## 2017-11-03 MED ORDER — ATORVASTATIN CALCIUM 10 MG PO TABS
10.0000 mg | ORAL_TABLET | Freq: Every day | ORAL | Status: DC
  Administered 2017-11-03: 10 mg via ORAL
  Filled 2017-11-03: qty 1

## 2017-11-03 MED ORDER — ACETAMINOPHEN 325 MG PO TABS: 650.0000 mg | ORAL_TABLET | ORAL | Status: DC | PRN

## 2017-11-03 MED ORDER — DEXAMETHASONE SODIUM PHOSPHATE 10 MG/ML IJ SOLN
INTRAMUSCULAR | Status: DC | PRN
  Administered 2017-11-03: 4 mg via INTRAVENOUS

## 2017-11-03 MED ORDER — CANAGLIFLOZIN 100 MG PO TABS
100.0000 mg | ORAL_TABLET | Freq: Every day | ORAL | Status: DC
  Administered 2017-11-04: 100 mg via ORAL
  Filled 2017-11-03: qty 1

## 2017-11-03 MED ORDER — LEVOTHYROXINE SODIUM 50 MCG PO TABS
50.0000 ug | ORAL_TABLET | Freq: Every day | ORAL | Status: DC
  Administered 2017-11-03 – 2017-11-04 (×2): 50 ug via ORAL
  Filled 2017-11-03 (×2): qty 1

## 2017-11-03 MED ORDER — ONDANSETRON HCL 4 MG/2ML IJ SOLN: 4.0000 mg | Freq: Four times a day (QID) | INTRAMUSCULAR | Status: DC | PRN

## 2017-11-03 MED ORDER — GLIPIZIDE 10 MG PO TABS
10.0000 mg | ORAL_TABLET | Freq: Every day | ORAL | Status: DC
  Administered 2017-11-03: 10 mg via ORAL
  Filled 2017-11-03 (×2): qty 1

## 2017-11-03 MED ORDER — LISINOPRIL 5 MG PO TABS
5.0000 mg | ORAL_TABLET | Freq: Every day | ORAL | Status: DC
  Administered 2017-11-03: 5 mg via ORAL
  Filled 2017-11-03: qty 1

## 2017-11-03 MED ORDER — METFORMIN HCL 500 MG PO TABS
1000.0000 mg | ORAL_TABLET | Freq: Two times a day (BID) | ORAL | Status: DC
  Administered 2017-11-03 – 2017-11-04 (×2): 1000 mg via ORAL
  Filled 2017-11-03 (×2): qty 2

## 2017-11-03 MED ORDER — SODIUM CHLORIDE 0.9 % IV SOLN
INTRAVENOUS | Status: DC | PRN
  Administered 2017-11-03: 20 ug/min via INTRAVENOUS

## 2017-11-03 MED ORDER — SUGAMMADEX SODIUM 200 MG/2ML IV SOLN
INTRAVENOUS | Status: DC | PRN
  Administered 2017-11-03: 200 mg via INTRAVENOUS

## 2017-11-03 MED ORDER — DOBUTAMINE IN D5W 4-5 MG/ML-% IV SOLN
INTRAVENOUS | Status: AC
  Filled 2017-11-03: qty 250

## 2017-11-03 MED ORDER — TAMSULOSIN HCL 0.4 MG PO CAPS
0.4000 mg | ORAL_CAPSULE | Freq: Two times a day (BID) | ORAL | Status: DC
  Administered 2017-11-03 (×2): 0.4 mg via ORAL
  Filled 2017-11-03 (×2): qty 1

## 2017-11-03 MED ORDER — SODIUM CHLORIDE 0.9% FLUSH: 3.0000 mL | INTRAVENOUS | Status: DC | PRN

## 2017-11-03 MED ORDER — PROTAMINE SULFATE 10 MG/ML IV SOLN
INTRAVENOUS | Status: DC | PRN
  Administered 2017-11-03: 20 mg via INTRAVENOUS
  Administered 2017-11-03 (×2): 10 mg via INTRAVENOUS

## 2017-11-03 MED ORDER — GABAPENTIN 800 MG PO TABS
800.0000 mg | ORAL_TABLET | Freq: Three times a day (TID) | ORAL | Status: DC
Start: 2017-11-03 — End: 2017-11-03
  Filled 2017-11-03 (×2): qty 1

## 2017-11-03 MED ORDER — SODIUM CHLORIDE 0.9 % IV SOLN: 250.0000 mL | INTRAVENOUS | Status: DC | PRN

## 2017-11-03 MED ORDER — RIVAROXABAN 20 MG PO TABS
20.0000 mg | ORAL_TABLET | Freq: Every day | ORAL | Status: DC
  Administered 2017-11-03: 20 mg via ORAL
  Filled 2017-11-03: qty 1

## 2017-11-03 MED ORDER — TESTOSTERONE CYPIONATE 200 MG/ML IM SOLN: 200.0000 mg | INTRAMUSCULAR | Status: DC

## 2017-11-03 MED ORDER — HEPARIN (PORCINE) IN NACL 1000-0.9 UT/500ML-% IV SOLN
INTRAVENOUS | Status: AC
  Filled 2017-11-03: qty 2500

## 2017-11-03 MED ORDER — LIDOCAINE HCL (CARDIAC) PF 100 MG/5ML IV SOSY
PREFILLED_SYRINGE | INTRAVENOUS | Status: DC | PRN
  Administered 2017-11-03: 40 mg via INTRAVENOUS

## 2017-11-03 MED ORDER — ALLOPURINOL 300 MG PO TABS
300.0000 mg | ORAL_TABLET | Freq: Every day | ORAL | Status: DC
  Administered 2017-11-03: 300 mg via ORAL
  Filled 2017-11-03: qty 1

## 2017-11-03 MED ORDER — INSULIN ASPART 100 UNIT/ML ~~LOC~~ SOLN
0.0000 [IU] | SUBCUTANEOUS | Status: DC
  Administered 2017-11-03: 3 [IU] via SUBCUTANEOUS

## 2017-11-03 MED ORDER — PANTOPRAZOLE SODIUM 40 MG PO TBEC
40.0000 mg | DELAYED_RELEASE_TABLET | Freq: Every day | ORAL | Status: DC
Start: 2017-11-03 — End: 2017-11-04
  Administered 2017-11-03: 40 mg via ORAL
  Filled 2017-11-03: qty 1

## 2017-11-03 MED ORDER — HEPARIN SODIUM (PORCINE) 1000 UNIT/ML IJ SOLN
INTRAMUSCULAR | Status: DC | PRN
  Administered 2017-11-03: 1000 [IU] via INTRAVENOUS

## 2017-11-03 MED ORDER — FENTANYL CITRATE (PF) 100 MCG/2ML IJ SOLN
INTRAMUSCULAR | Status: DC | PRN
  Administered 2017-11-03: 100 ug via INTRAVENOUS

## 2017-11-03 MED ORDER — HEPARIN SODIUM (PORCINE) 1000 UNIT/ML IJ SOLN
INTRAMUSCULAR | Status: DC | PRN
  Administered 2017-11-03: 14000 [IU] via INTRAVENOUS
  Administered 2017-11-03: 4000 [IU] via INTRAVENOUS
  Administered 2017-11-03: 6000 [IU] via INTRAVENOUS

## 2017-11-03 MED ORDER — AMIODARONE HCL 200 MG PO TABS
200.0000 mg | ORAL_TABLET | Freq: Every day | ORAL | Status: DC
  Administered 2017-11-03: 200 mg via ORAL
  Filled 2017-11-03: qty 1

## 2017-11-03 MED ORDER — PROPOFOL 10 MG/ML IV BOLUS
INTRAVENOUS | Status: DC | PRN
  Administered 2017-11-03: 140 mg via INTRAVENOUS

## 2017-11-03 MED ORDER — INSULIN ASPART 100 UNIT/ML ~~LOC~~ SOLN
0.0000 [IU] | SUBCUTANEOUS | Status: DC
  Administered 2017-11-03 – 2017-11-04 (×2): 2 [IU] via SUBCUTANEOUS

## 2017-11-03 MED ORDER — DOBUTAMINE IN D5W 4-5 MG/ML-% IV SOLN
INTRAVENOUS | Status: DC | PRN
  Administered 2017-11-03: 20 ug/kg/min via INTRAVENOUS

## 2017-11-03 MED ORDER — INSULIN ASPART 100 UNIT/ML ~~LOC~~ SOLN: 0.0000 [IU] | Freq: Three times a day (TID) | SUBCUTANEOUS | Status: DC

## 2017-11-03 MED ORDER — METOPROLOL TARTRATE 25 MG PO TABS
25.0000 mg | ORAL_TABLET | Freq: Two times a day (BID) | ORAL | Status: DC
Start: 2017-11-03 — End: 2017-11-04
  Administered 2017-11-03 (×2): 25 mg via ORAL
  Filled 2017-11-03 (×2): qty 1

## 2017-11-03 MED ORDER — ROCURONIUM BROMIDE 10 MG/ML (PF) SYRINGE
PREFILLED_SYRINGE | INTRAVENOUS | Status: DC | PRN
  Administered 2017-11-03 (×2): 10 mg via INTRAVENOUS
  Administered 2017-11-03: 50 mg via INTRAVENOUS
  Administered 2017-11-03 (×2): 10 mg via INTRAVENOUS

## 2017-11-03 MED ORDER — MIDAZOLAM HCL 5 MG/5ML IJ SOLN
INTRAMUSCULAR | Status: DC | PRN
  Administered 2017-11-03: 2 mg via INTRAVENOUS

## 2017-11-03 MED ORDER — LACTATED RINGERS IV SOLN: INTRAVENOUS | Status: DC | PRN

## 2017-11-03 MED ORDER — VITAMIN D3 25 MCG (1000 UNIT) PO TABS
1000.0000 [IU] | ORAL_TABLET | Freq: Every day | ORAL | Status: DC
Start: 2017-11-03 — End: 2017-11-04
  Administered 2017-11-03: 1000 [IU] via ORAL
  Filled 2017-11-03 (×3): qty 1

## 2017-11-03 SURGICAL SUPPLY — 20 items
BAG SNAP BAND KOVER 36X36 (MISCELLANEOUS) ×2 IMPLANT
BLANKET WARM UNDERBOD FULL ACC (MISCELLANEOUS) ×2 IMPLANT
CATH MAPPNG PENTARAY F 2-6-2MM (CATHETERS) ×1 IMPLANT
CATH SMTCH THERMOCOOL SF DF (CATHETERS) ×2 IMPLANT
CATH SOUNDSTAR 3D IMAGING (CATHETERS) ×2 IMPLANT
CATH WEBSTER BI DIR CS D-F CRV (CATHETERS) ×2 IMPLANT
COVER SWIFTLINK CONNECTOR (BAG) ×2 IMPLANT
PACK EP LATEX FREE (CUSTOM PROCEDURE TRAY) ×1
PACK EP LF (CUSTOM PROCEDURE TRAY) ×1 IMPLANT
PAD DEFIB LIFELINK (PAD) ×2 IMPLANT
PATCH CARTO3 (PAD) ×2 IMPLANT
PENTARAY F 2-6-2MM (CATHETERS) ×2
SHEATH AVANTI 11F 11CM (SHEATH) ×2 IMPLANT
SHEATH BAYLIS SUREFLEX  M 8.5 (SHEATH) ×2
SHEATH BAYLIS SUREFLEX M 8.5 (SHEATH) ×2 IMPLANT
SHEATH BAYLIS TRANSSEPTAL 98CM (NEEDLE) ×2 IMPLANT
SHEATH PINNACLE 7F 10CM (SHEATH) ×2 IMPLANT
SHEATH PINNACLE 8F 10CM (SHEATH) ×4 IMPLANT
SHEATH PINNACLE 9F 10CM (SHEATH) ×4 IMPLANT
TUBING SMART ABLATE COOLFLOW (TUBING) ×2 IMPLANT

## 2017-11-03 NOTE — Progress Notes (Signed)
Talked to pharmacy, stated okay to give metformin

## 2017-11-03 NOTE — Anesthesia Procedure Notes (Signed)
Procedure Name: Intubation Date/Time: 11/03/2017 7:54 AM Performed by: Carney Living, CRNA Pre-anesthesia Checklist: Patient identified, Emergency Drugs available, Suction available, Patient being monitored and Timeout performed Patient Re-evaluated:Patient Re-evaluated prior to induction Oxygen Delivery Method: Circle system utilized Preoxygenation: Pre-oxygenation with 100% oxygen Induction Type: IV induction Ventilation: Mask ventilation without difficulty and Oral airway inserted - appropriate to patient size Laryngoscope Size: Mac and 4 Grade View: Grade II Tube type: Oral Tube size: 7.5 mm Number of attempts: 1 Airway Equipment and Method: Stylet Placement Confirmation: ETT inserted through vocal cords under direct vision,  positive ETCO2 and breath sounds checked- equal and bilateral Secured at: 23 cm Tube secured with: Tape Dental Injury: Teeth and Oropharynx as per pre-operative assessment

## 2017-11-03 NOTE — Discharge Instructions (Signed)
Post procedure care instructions No driving for 4 days. No lifting over 5 lbs for 1 week. No vigorous or sexual activity for 1 week. You may return to work on 11/10/17. Keep procedure site clean & dry. If you notice increased pain, swelling, bleeding or pus, call/return!  You may shower, but no soaking baths/hot tubs/pools for 1 week.      You have an appointment set up with the Atrial Fibrillation Clinic.  Multiple studies have shown that being followed by a dedicated atrial fibrillation clinic in addition to the standard care you receive from your other physicians improves health. We believe that enrollment in the atrial fibrillation clinic will allow us to better care for you.   The phone number to the Atrial Fibrillation Clinic is [phone]. The clinic is staffed Monday through Friday from 8:30am to 5pm.  Parking Directions: The clinic is located in the Heart and Vascular Building [facility]. 1)From [address] on to [address] and go to the 3rd entrance  (Heart and Vascular entrance) on the right. 2)Look to the right for Heart &Vascular Parking Garage. 3)A code for the entrance is required please call the clinic to receive this.   4)Take the elevators to the 1st floor. Registration is in the room with the glass walls at the end of the hallway.  If you have any trouble parking or locating the clinic, please dont hesitate to call [phone].

## 2017-11-03 NOTE — Progress Notes (Signed)
Patient stable, report received from RN. Patient requests new CPAP mask- RT called.

## 2017-11-03 NOTE — Progress Notes (Signed)
Called RT for pt needing CPAP at bedtime

## 2017-11-03 NOTE — Transfer of Care (Addendum)
Immediate Anesthesia Transfer of Care Note  Patient: Jesus Mccullough  Procedure(s) Performed: ATRIAL FIBRILLATION ABLATION (N/A )  Patient Location: Cath Lab  Anesthesia Type:General  Level of Consciousness: awake, alert , oriented and patient cooperative  Airway & Oxygen Therapy: Patient Spontanous Breathing and Patient connected to nasal cannula oxygen  Post-op Assessment: Report given to RN, Post -op Vital signs reviewed and stable and Patient moving all extremities X 4  Post vital signs: Reviewed and stable  Last Vitals:  Vitals Value Taken Time  BP 131/75 11/03/2017 10:26 AM  Temp 36.4 C 11/03/2017 10:29 AM  Pulse 59 11/03/2017 10:30 AM  Resp 12 11/03/2017 10:30 AM  SpO2 89 % 11/03/2017 10:30 AM  Vitals shown include unvalidated device data.  Last Pain:  Vitals:   11/03/17 1029  TempSrc: Temporal  PainSc: 0-No pain      Patients Stated Pain Goal: 5 (11/03/17 0554)  Complications: After charting I spoke with patient, he was able to move all extremities to command, tongue to midline, no droop with smile, hand grips were equal and strong, However, pt was unable to state his name and birthdate, responses were incomplete and inappropriate. After a few minutes Dr Bradley FerrisEllender and Dr. [name] were called to bedside. Dr [name] asked pt name and location and pt answered appropriately to both questions, he knew Dr [name] name and from this point, answered question appropriately, VSS

## 2017-11-03 NOTE — Progress Notes (Signed)
Pt educated on low blood sugar signs and symptoms and when to call staff  Pt verbalizes understanding

## 2017-11-03 NOTE — Anesthesia Postprocedure Evaluation (Signed)
Anesthesia Post Note  Patient: Jesus Mccullough  Procedure(s) Performed: ATRIAL FIBRILLATION ABLATION (N/A )     Patient location during evaluation: PACU Anesthesia Type: General Level of consciousness: awake and alert Pain management: pain level controlled Vital Signs Assessment: post-procedure vital signs reviewed and stable Respiratory status: spontaneous breathing, nonlabored ventilation, respiratory function stable and patient connected to nasal cannula oxygen Cardiovascular status: blood pressure returned to baseline and stable Postop Assessment: no apparent nausea or vomiting Anesthetic complications: no    Last Vitals:  Vitals:   11/03/17 1400 11/03/17 1430  BP: (!) 165/124 (!) 147/69  Pulse: 62 73  Resp:    Temp:    SpO2:      Last Pain:  Vitals:   11/03/17 1301  TempSrc:   PainSc: 0-No pain                 Alyce Inscore P Paitlyn Mcclatchey

## 2017-11-03 NOTE — H&P (Signed)
Jesus Mccullough has presented today for surgery, with the diagnosis of atrial fibrillation.  The various methods of treatment have been discussed with the patient and family. After consideration of risks, benefits and other options for treatment, the patient has consented to  Procedure(s): Catheter ablation as a surgical intervention .  Risks include but not limited to bleeding, tamponade, heart block, stroke, damage to surrounding organs, among others. The patient's history has been reviewed, patient examined, no change in status, stable for surgery.  I have reviewed the patient's chart and labs.  Questions were answered to the patient's satisfaction.    Debie Ashline [name], MD 11/03/2017 7:06 AM

## 2017-11-03 NOTE — Progress Notes (Signed)
Site area: left groin fv sheaths x2 pulled and pressure held by Leroy Kennedyamon Meza,RN Site Prior to Removal:  Level 0 Pressure Applied For: 20 minutes Manual:   yes Patient Status During Pull:  stable Post Pull Site:  Level 0 Post Pull Instructions Given:  yes Post Pull Pulses Present: left dp palpable Dressing Applied:  Gauze and tegaderm Bedrest begins @  1205 Comments:  IV saline locked

## 2017-11-03 NOTE — Progress Notes (Signed)
Site area: rt groin fv sheaths x2 pulled and pressure held by Leroy Kennedyamon Meza,RN Site Prior to Removal:  Level 0 Pressure Applied For: 20 minutes Manual:   yes Patient Status During Pull:  stable Post Pull Site:  Level 0 Post Pull Instructions Given:  yes Post Pull Pulses Present: rt dp palpable Dressing Applied:  Gauze and tegaderm Bedrest begins @  Comments:

## 2017-11-04 ENCOUNTER — Other Ambulatory Visit: Payer: Self-pay

## 2017-11-04 DIAGNOSIS — E785 Hyperlipidemia, unspecified: Secondary | ICD-10-CM | POA: Diagnosis not present

## 2017-11-04 DIAGNOSIS — I480 Paroxysmal atrial fibrillation: Secondary | ICD-10-CM | POA: Diagnosis not present

## 2017-11-04 DIAGNOSIS — I10 Essential (primary) hypertension: Secondary | ICD-10-CM | POA: Diagnosis not present

## 2017-11-04 DIAGNOSIS — I4892 Unspecified atrial flutter: Secondary | ICD-10-CM | POA: Diagnosis not present

## 2017-11-04 LAB — LIPID PANEL
Cholesterol: 115 mg/dL (ref 0–200)
HDL: 36 mg/dL — ABNORMAL LOW (ref >40)
LDL Cholesterol: 64 mg/dL (ref 0–99)
TRIGLYCERIDES: 74 mg/dL (ref <150)
Total CHOL/HDL Ratio: 3.2 RATIO
VLDL: 15 mg/dL (ref 0–40)

## 2017-11-04 LAB — GLUCOSE, CAPILLARY
GLUCOSE-CAPILLARY: 145 mg/dL — AB (ref 70–99)
Glucose-Capillary: 111 mg/dL — ABNORMAL HIGH (ref 70–99)

## 2017-11-04 NOTE — Plan of Care (Signed)
  Problem: Education: Goal: Knowledge of General Education information will improve Description Including pain rating scale, medication(s)/side effects and non-pharmacologic comfort measures Outcome: Adequate for Discharge   Problem: Health Behavior/Discharge Planning: Goal: Ability to manage health-related needs will improve Outcome: Adequate for Discharge   

## 2017-11-04 NOTE — Discharge Summary (Addendum)
ELECTROPHYSIOLOGY PROCEDURE DISCHARGE SUMMARY    Patient ID: Jesus Mccullough,  MRN: [id number], DOB/AGE: 08-27-1950 67 y.o.  Admit date: 11/03/2017 Discharge date: 11/04/2017  Primary Care Physician: [name], NP  Primary Cardiologist: Dr. Tomie China Electrophysiologist: Dr. Elberta Fortis  Primary Discharge Diagnosis:  1. Paroxysmal AFib/flutter     CHA2DS2Vasc is 3, on xarelto  Secondary Discharge Diagnosis:  1. HTN 2. HLD 3. DM 4. Obesity  Procedures This Admission:  1.  Electrophysiology study and radiofrequency catheter ablation on 11/03/17 by Dr. Elberta Fortis  This study demonstrated   CONCLUSIONS: 1. Sinus rhythm upon presentation.   2. Successful electrical isolation and anatomical encircling of the right inferior pulmonary vein with radiofrequency current.    3. Cavo-tricuspid isthmus ablation was performed with complete bidirectional isthmus block achieved.  4. No inducible arrhythmias following ablation both on and off of dobutamine 5. No early apparent complications.   Brief HPI: Jesus Mccullough is a 67 y.o. male with a history of paroxysmal atrial fibrillation withprior cryoablation of his AFib.  They have failed medical therapy with amiodarone. Risks, benefits, and alternatives to catheter ablation of atrial fibrillation were reviewed with the patient who wished to proceed.  The patient underwent cardiac CT prior to the procedure which demonstrated no LAA thrombus.  Hospital Course:  The patient was admitted and underwent EPS/RFCA of atrial fibrillation with details as outlined above.  They were monitored on telemetry overnight which demonstrated SR.  Groin was without complication on the day of discharge.    On his pre-procedure CT, coronary Ca++ score was 367, the CT was forwarded to his primary cardiologist Dr. Tomie China who Howie Rufus follow this with the patient, and requested that we have fasting lipid panel done while here (done) results pending, to follow up outpatient  with Dr. Tomie China The patient feels well this morning, no CO or SOB, no site pain b/l, he was examined by Dr. Elberta Fortis and considered to be stable for discharge.  Wound care and restrictions were reviewed with the patient.  The patient Afton Lavalle be seen back by Rudi Coco, NP in 4 weeks and Dr Elberta Fortis in 12 weeks for post ablation follow up.      Physical Exam: Vitals:   11/03/17 2002 11/04/17 0101 11/04/17 0424 11/04/17 0425  BP: 112/65 (!) 118/58  127/75  Pulse: 62 60  (!) 56  Resp: 20 20  18   Temp: 98 F (36.7 C) (!) 97.5 F (36.4 C)  (!) 97.4 F (36.3 C)  TempSrc: Oral Oral  Oral  SpO2: 93% 99%  99%  Weight:   244 lb 11.4 oz (111 kg)   Height:        GEN- The patient is well appearing, alert and oriented x 3 today.   HEENT: normocephalic, atraumatic; sclera clear, conjunctiva pink; hearing intact; oropharynx clear; neck supple  Lungs- CTA b/l, normal work of breathing.  No wheezes, rales, rhonchi Heart-RRR, no murmurs, rubs or gallops  GI- soft, non-tender, non-distended Extremities- no clubbing, cyanosis, or edema; b/l groins are without hematoma/bruit MS- no significant deformity or atrophy Skin- warm and dry, no rash or lesion Psych- euthymic mood, full affect Neuro- strength and sensation are intact   Labs:   Lab Results  Component Value Date   WBC 8.1 10/13/2017   HGB 15.5 10/13/2017   HCT 45.3 10/13/2017   MCV 92 10/13/2017   PLT 317 10/13/2017   No results for input(s): NA, K, CL, CO2, BUN, CREATININE, CALCIUM, PROT, BILITOT, ALKPHOS, ALT,  AST, GLUCOSE in the last 168 hours.  Invalid input(s): LABALBU   Discharge Medications:  Allergies as of 11/04/2017      Reactions   Antihistamines, Chlorpheniramine-type    Aspirin    Nyquil Multi-symptom [pseudoeph-doxylamine-dm-apap]       Medication List    TAKE these medications   allopurinol 300 MG tablet Commonly known as:  ZYLOPRIM Take 300 mg by mouth daily.   amiodarone 200 MG tablet Commonly known  as:  PACERONE Take 200 mg by mouth daily.   APPLE CIDER VINEGAR PO Take 1 capsule by mouth daily.   atorvastatin 10 MG tablet Commonly known as:  LIPITOR Take 10 mg by mouth daily.   Fish Oil 1000 MG Caps Take 1,000 mg by mouth daily.   gabapentin 800 MG tablet Commonly known as:  NEURONTIN Take 800 mg by mouth 3 (three) times daily.   glipiZIDE 10 MG tablet Commonly known as:  GLUCOTROL Take 10 mg by mouth daily.   JARDIANCE 25 MG Tabs tablet Generic drug:  empagliflozin Take 25 mg by mouth daily.   levothyroxine 50 MCG tablet Commonly known as:  SYNTHROID, LEVOTHROID Take 50 mcg by mouth daily.   lisinopril 5 MG tablet Commonly known as:  PRINIVIL,ZESTRIL Take 5 mg by mouth daily.   metFORMIN 500 MG tablet Commonly known as:  GLUCOPHAGE Take 1,000 mg by mouth 2 (two) times daily.   metoprolol tartrate 50 MG tablet Commonly known as:  LOPRESSOR Take 25 mg by mouth 2 (two) times daily.   omeprazole 40 MG capsule Commonly known as:  PRILOSEC Take 40 mg by mouth daily.   POTASSIUM PO Take 1 tablet by mouth daily.   tamsulosin 0.4 MG Caps capsule Commonly known as:  FLOMAX Take 0.4 mg by mouth 2 (two) times daily.   testosterone cypionate 200 MG/ML injection Commonly known as:  DEPOTESTOSTERONE CYPIONATE Inject 200 mg into the muscle every 14 (fourteen) days.   Vitamin D3 1000 units Caps Take 1 capsule by mouth daily.   XARELTO 20 MG Tabs tablet Generic drug:  rivaroxaban Take 20 mg by mouth daily.       Disposition:  Home Discharge Instructions    Diet - low sodium heart healthy   Complete by:  As directed    Increase activity slowly   Complete by:  As directed      Follow-up Information    [facility] Follow up on 12/01/2017.   Specialty:  Cardiology Why:  2:30PM Contact information: [address] [id number] [place]c [address] [id number] [phone]       Regan Lemmingamnitz, Jarris Kortz Martin, MD Follow up on  02/04/2018.   Specialty:  Cardiology Why:  9:30AM Contact information: [address] [place] [address] [phone]           Duration of Discharge Encounter: Greater than 30 minutes including physician time.  Norma FredricksonSigned, Renee Ursuy, PA-C 11/04/2017 8:31 AM  I have seen and examined this patient with Francis Dowseenee Ursuy.  Agree with above, note added to reflect my findings.  On exam, RRR, no murmurs, lungs clear.  Patient had AF ablation for persistent atrial fibrillation. Tolerated the procedure well without complaint this morning. Plan for discharge today with follow up in EP clinic.  Sitara Cashwell M. Deneise Getty MD 11/04/2017 8:55 AM

## 2017-11-04 NOTE — Progress Notes (Signed)
Paged MD to clarify insulin/CBG orders.

## 2017-11-04 NOTE — Progress Notes (Signed)
Patient resting comfortably during shift report. Denies complaints.  

## 2017-11-04 NOTE — Progress Notes (Signed)
Patient informed of lipid panel test ordered for this am, requested he hold off on eating breakfast if it should arrive prior to phlebotomy.

## 2017-11-04 NOTE — Progress Notes (Signed)
Call placed to [facility] to notify of telemetry monitoring d/c.   

## 2017-12-01 ENCOUNTER — Ambulatory Visit (HOSPITAL_COMMUNITY): Payer: Medicare HMO | Admitting: Nurse Practitioner

## 2017-12-02 ENCOUNTER — Ambulatory Visit (HOSPITAL_COMMUNITY)
Admission: RE | Admit: 2017-12-02 | Discharge: 2017-12-02 | Disposition: A | Discharge status: Home / Self Care | Payer: Medicare HMO | Source: Ambulatory Visit | Attending: Nurse Practitioner | Admitting: Nurse Practitioner

## 2017-12-02 ENCOUNTER — Encounter (HOSPITAL_COMMUNITY): Payer: Self-pay | Admitting: Nurse Practitioner

## 2017-12-02 VITALS — BP 124/72 | Ht 69.0 in | Wt 246.6 lb

## 2017-12-02 DIAGNOSIS — Z79899 Other long term (current) drug therapy: Secondary | ICD-10-CM | POA: Insufficient documentation

## 2017-12-02 DIAGNOSIS — I480 Paroxysmal atrial fibrillation: Secondary | ICD-10-CM | POA: Diagnosis not present

## 2017-12-02 DIAGNOSIS — Z7901 Long term (current) use of anticoagulants: Secondary | ICD-10-CM | POA: Diagnosis not present

## 2017-12-02 DIAGNOSIS — Z886 Allergy status to analgesic agent status: Secondary | ICD-10-CM | POA: Diagnosis not present

## 2017-12-02 DIAGNOSIS — I440 Atrioventricular block, first degree: Secondary | ICD-10-CM | POA: Insufficient documentation

## 2017-12-02 DIAGNOSIS — E782 Mixed hyperlipidemia: Secondary | ICD-10-CM | POA: Diagnosis not present

## 2017-12-02 DIAGNOSIS — E088 Diabetes mellitus due to underlying condition with unspecified complications: Secondary | ICD-10-CM | POA: Diagnosis not present

## 2017-12-02 DIAGNOSIS — Z7984 Long term (current) use of oral hypoglycemic drugs: Secondary | ICD-10-CM | POA: Diagnosis not present

## 2017-12-02 DIAGNOSIS — I10 Essential (primary) hypertension: Secondary | ICD-10-CM | POA: Diagnosis not present

## 2017-12-02 DIAGNOSIS — Z9889 Other specified postprocedural states: Secondary | ICD-10-CM | POA: Diagnosis present

## 2017-12-02 NOTE — Patient Instructions (Addendum)
ERROR

## 2017-12-02 NOTE — Progress Notes (Signed)
Primary Care Physician: [name], [name], NP Referring Physician:Dr. Minerva EndsCamnitz   Bohden R Decou is a 67 y.o. male with a h/o afib s/p ablation one month ago for f/u in the afib clinic. He reports no afib that he is aware of. He continues to take  amiodarone and xarelto. He denies any swallowing or groin issues.   Today, he denies symptoms of palpitations, chest pain, shortness of breath, orthopnea, PND, lower extremity edema, dizziness, presyncope, syncope, or neurologic sequela. The patient is tolerating medications without difficulties and is otherwise without complaint today.   Past Medical History:  Diagnosis Date  . Arrhythmia   . Diabetes mellitus due to underlying condition with unspecified complications (HCC) 12/16/2016  . Essential hypertension 12/16/2016  . Mixed dyslipidemia 12/16/2016  . Overweight 12/16/2016  . PAF (paroxysmal atrial fibrillation) (HCC) 12/16/2016   Past Surgical History:  Procedure Laterality Date  . ATRIAL FIBRILLATION ABLATION N/A 11/03/2017   Procedure: ATRIAL FIBRILLATION ABLATION;  Surgeon: Regan Lemmingamnitz, Will Martin, MD;  Location: [facility];  Service: Cardiovascular;  Laterality: N/A;    Current Outpatient Medications  Medication Sig Dispense Refill  . allopurinol (ZYLOPRIM) 300 MG tablet Take 300 mg by mouth daily.    Marland Kitchen. amiodarone (PACERONE) 200 MG tablet Take 200 mg by mouth daily.    . APPLE CIDER VINEGAR PO Take 1 capsule by mouth daily.    Marland Kitchen. atorvastatin (LIPITOR) 10 MG tablet Take 10 mg by mouth daily.     . Cholecalciferol (VITAMIN D3) 1000 units CAPS Take 1 capsule by mouth daily.    . empagliflozin (JARDIANCE) 25 MG TABS tablet Take 25 mg by mouth daily.    Marland Kitchen. gabapentin (NEURONTIN) 800 MG tablet Take 800 mg by mouth 3 (three) times daily.     Marland Kitchen. glipiZIDE (GLUCOTROL) 10 MG tablet Take 10 mg by mouth daily.    Marland Kitchen. levothyroxine (SYNTHROID, LEVOTHROID) 50 MCG tablet Take 50 mcg by mouth daily.  3  . lisinopril (PRINIVIL,ZESTRIL) 5 MG tablet Take 5  mg by mouth daily.     . metFORMIN (GLUCOPHAGE) 500 MG tablet Take 1,000 mg by mouth 2 (two) times daily.    . metoprolol tartrate (LOPRESSOR) 50 MG tablet Take 25 mg by mouth 2 (two) times daily.    . Omega-3 Fatty Acids (FISH OIL) 1000 MG CAPS Take 1,000 mg by mouth daily.     Marland Kitchen. omeprazole (PRILOSEC) 40 MG capsule Take 40 mg by mouth daily.    Marland Kitchen. POTASSIUM PO Take 1 tablet by mouth daily.    . tamsulosin (FLOMAX) 0.4 MG CAPS capsule Take 0.4 mg by mouth 2 (two) times daily.     Marland Kitchen. testosterone cypionate (DEPOTESTOSTERONE CYPIONATE) 200 MG/ML injection Inject 200 mg into the muscle every 14 (fourteen) days.     [name]. XARELTO 20 MG TABS tablet Take 20 mg by mouth daily.     No current facility-administered medications for this encounter.     Allergies  Allergen Reactions  . Antihistamines, Chlorpheniramine-Type   . Aspirin   . Nyquil Multi-Symptom [Pseudoeph-Doxylamine-Dm-Apap]     Social History   Socioeconomic History  . Marital status: Widowed    Spouse name: Not on file  . Number of children: Not on file  . Years of education: Not on file  . Highest education level: Not on file  Occupational History  . Not on file  Social Needs  . Financial resource strain: Not on file  . Food insecurity:    Worry: Not  on file    Inability: Not on file  . Transportation needs:    Medical: Not on file    Non-medical: Not on file  Tobacco Use  . Smoking status: Never Smoker  . Smokeless tobacco: Never Used  Substance and Sexual Activity  . Alcohol use: No  . Drug use: No  . Sexual activity: Not on file  Lifestyle  . Physical activity:    Days per week: Not on file    Minutes per session: Not on file  . Stress: Not on file  Relationships  . Social connections:    Talks on phone: Not on file    Gets together: Not on file    Attends religious service: Not on file    Active member of club or organization: Not on file    Attends meetings of clubs or organizations: Not on file     Relationship status: Not on file  . Intimate partner violence:    Fear of current or ex partner: Not on file    Emotionally abused: Not on file    Physically abused: Not on file    Forced sexual activity: Not on file  Other Topics Concern  . Not on file  Social History Narrative  . Not on file    Family History  Problem Relation Age of Onset  . Heart disease Mother   . Heart disease Maternal Uncle   . Heart attack Maternal Uncle     ROS- All systems are reviewed and negative except as per the HPI above  Physical Exam: Vitals:   12/02/17 1509  BP: 124/72  Weight: 111.9 kg  Height: 5\' 9"  (1.753 m)   Wt Readings from Last 3 Encounters:  12/02/17 111.9 kg  11/04/17 111 kg  10/13/17 114.5 kg    Labs: Lab Results  Component Value Date   NA 140 10/13/2017   K 4.9 10/13/2017   CL 102 10/13/2017   CO2 21 10/13/2017   GLUCOSE 87 10/13/2017   BUN 18 10/13/2017   CREATININE 1.22 10/13/2017   CALCIUM 9.7 10/13/2017   No results found for: INR Lab Results  Component Value Date   CHOL 115 11/04/2017   HDL 36 (L) 11/04/2017   LDLCALC 64 11/04/2017   TRIG 74 11/04/2017     GEN- The patient is well appearing, alert and oriented x 3 today.   Head- normocephalic, atraumatic Eyes-  Sclera clear, conjunctiva pink Ears- hearing intact Oropharynx- clear Neck- supple, no JVP Lymph- no cervical lymphadenopathy Lungs- Clear to ausculation bilaterally, normal work of breathing Heart- Regular rate and rhythm, no murmurs, rubs or gallops, PMI not laterally displaced GI- soft, NT, ND, + BS Extremities- no clubbing, cyanosis, or edema MS- no significant deformity or atrophy Skin- no rash or lesion Psych- euthymic mood, full affect Neuro- strength and sensation are intact  EKG-Sinus rhythm with first degree block, PR int 234 ms, qrs int 82 ms, qtc 420 ms Epic records reviewed    Assessment and Plan: 1. Afib S/p ablation and is doing well staying in SR Continue  amiodarone at 200 mg daily Continue xarelto at 20 mg daily He is aware not to interrupt anticoagulation   2. HTN Stable  F/u with Dr. [name] 10/24  Elvina Sidleonna C. Matthew Folksarroll, ANP-C [facility] [address] [place], [place] [address] [phone]

## 2017-12-03 NOTE — Addendum Note (Signed)
Encounter addended by: Awilda BillBecker, Aicha Clingenpeel D, CMA on: 12/03/2017 8:41 AM  Actions taken: Sign clinical note, Order list changed

## 2017-12-15 ENCOUNTER — Encounter (HOSPITAL_COMMUNITY): Payer: Self-pay

## 2017-12-15 ENCOUNTER — Ambulatory Visit (HOSPITAL_COMMUNITY): Admit: 2017-12-15 | Payer: Medicare HMO | Admitting: Cardiovascular Disease

## 2017-12-15 SURGERY — CARDIOVERSION
Anesthesia: General

## 2017-12-22 ENCOUNTER — Ambulatory Visit (HOSPITAL_COMMUNITY): Payer: Medicare HMO | Admitting: Nurse Practitioner

## 2018-01-05 ENCOUNTER — Ambulatory Visit: Payer: Medicare PPO | Admitting: Cardiology

## 2018-01-05 DIAGNOSIS — M7752 Other enthesopathy of left foot: Secondary | ICD-10-CM | POA: Insufficient documentation

## 2018-01-05 DIAGNOSIS — M25572 Pain in left ankle and joints of left foot: Secondary | ICD-10-CM

## 2018-01-05 HISTORY — DX: Other enthesopathy of left foot and ankle: M77.52

## 2018-01-05 HISTORY — DX: Pain in left ankle and joints of left foot: M25.572

## 2018-02-04 ENCOUNTER — Ambulatory Visit: Payer: Medicare HMO | Admitting: Cardiology

## 2018-02-23 ENCOUNTER — Ambulatory Visit: Payer: Medicare HMO | Admitting: Cardiology

## 2018-02-23 NOTE — Progress Notes (Deleted)
Electrophysiology Office Note   Date:  02/23/2018   ID:  Jesus MylarSidney R Colocho, DOB 08-27-1950, MRN [id number]  PCP:  [name], [name], NP  Cardiologist:  [name] Primary Electrophysiologist:  Regan LemmingWill Martin Eesa Justiss, MD    No chief complaint on file.    History of Present Illness: Jesus Mccullough is a 67 y.o. male who is being seen today for the evaluation of atrial fibrillation at the request of [name], [name], NP. Presenting today for electrophysiology evaluation. He has a history of paroxysmal atrial fibrillation, diabetes, hypertension, and hyperlipidemia. [facility]. He returned to clinic in October 2018 and was found to be in recurrent atrial fibrillation. He had cryoablation on 11/25/16.  He returned to clinic on 07/23/2017 and was in atrial flutter.  His main complaint today is of weakness, fatigue, and shortness of breath.  He has had hospitalizations in the past for tachycardia as well as hypotension.  He had a repeat atrial fibrillation ablation 11/03/2017.  Today, denies symptoms of palpitations, chest pain, shortness of breath, orthopnea, PND, lower extremity edema, claudication, dizziness, presyncope, syncope, bleeding, or neurologic sequela. The patient is tolerating medications without difficulties. ***    Past Medical History:  Diagnosis Date  . Arrhythmia   . Diabetes mellitus due to underlying condition with unspecified complications (HCC) 12/16/2016  . Essential hypertension 12/16/2016  . Mixed dyslipidemia 12/16/2016  . Overweight 12/16/2016  . PAF (paroxysmal atrial fibrillation) (HCC) 12/16/2016   Past Surgical History:  Procedure Laterality Date  . ATRIAL FIBRILLATION ABLATION N/A 11/03/2017   Procedure: ATRIAL FIBRILLATION ABLATION;  Surgeon: Regan Lemmingamnitz, Echo Propp Martin, MD;  Location: [facility];  Service: Cardiovascular;  Laterality: N/A;     Current Outpatient Medications  Medication Sig Dispense Refill  . allopurinol  (ZYLOPRIM) 300 MG tablet Take 300 mg by mouth daily.    Marland Kitchen. amiodarone (PACERONE) 200 MG tablet Take 200 mg by mouth daily.    . APPLE CIDER VINEGAR PO Take 1 capsule by mouth daily.    Marland Kitchen. atorvastatin (LIPITOR) 10 MG tablet Take 10 mg by mouth daily.     . Cholecalciferol (VITAMIN D3) 1000 units CAPS Take 1 capsule by mouth daily.    . empagliflozin (JARDIANCE) 25 MG TABS tablet Take 25 mg by mouth daily.    Marland Kitchen. gabapentin (NEURONTIN) 800 MG tablet Take 800 mg by mouth 3 (three) times daily.     Marland Kitchen. glipiZIDE (GLUCOTROL) 10 MG tablet Take 10 mg by mouth daily.    Marland Kitchen. levothyroxine (SYNTHROID, LEVOTHROID) 50 MCG tablet Take 50 mcg by mouth daily.  3  . lisinopril (PRINIVIL,ZESTRIL) 5 MG tablet Take 5 mg by mouth daily.     . metFORMIN (GLUCOPHAGE) 500 MG tablet Take 1,000 mg by mouth 2 (two) times daily.    . metoprolol tartrate (LOPRESSOR) 50 MG tablet Take 25 mg by mouth 2 (two) times daily.    . Omega-3 Fatty Acids (FISH OIL) 1000 MG CAPS Take 1,000 mg by mouth daily.     Marland Kitchen. omeprazole (PRILOSEC) 40 MG capsule Take 40 mg by mouth daily.    Marland Kitchen. POTASSIUM PO Take 1 tablet by mouth daily.    . tamsulosin (FLOMAX) 0.4 MG CAPS capsule Take 0.4 mg by mouth 2 (two) times daily.     Marland Kitchen. testosterone cypionate (DEPOTESTOSTERONE CYPIONATE) 200 MG/ML injection Inject 200 mg into the muscle every 14 (fourteen) days.     [name]. XARELTO 20 MG TABS tablet Take  20 mg by mouth daily.     No current facility-administered medications for this visit.     Allergies:   Antihistamines, chlorpheniramine-type; Aspirin; and Nyquil multi-symptom [pseudoeph-doxylamine-dm-apap]   Social History:  The patient  reports that he has never smoked. He has never used smokeless tobacco. He reports that he does not drink alcohol or use drugs.   Family History:  The patient's family history includes Heart attack in his maternal uncle; Heart disease in his maternal uncle and mother.   ROS:  Please see the history of present illness.    Otherwise, review of systems is positive for ***.   All other systems are reviewed and negative.   PHYSICAL EXAM: VS:  There were no vitals taken for this visit. , BMI There is no height or weight on file to calculate BMI. GEN: Well nourished, well developed, in no acute distress  HEENT: normal  Neck: no JVD, carotid bruits, or masses Cardiac: ***RRR; no murmurs, rubs, or gallops,no edema  Respiratory:  clear to auscultation bilaterally, normal work of breathing GI: soft, nontender, nondistended, + BS MS: no deformity or atrophy  Skin: warm and dry Neuro:  Strength and sensation are intact Psych: euthymic mood, full affect  EKG:  EKG {ACTION; IS/IS [id number]} ordered today. Personal review of the ekg ordered *** shows ***   Recent Labs: 10/13/2017: ALT 21; BUN 18; Creatinine, Ser 1.22; Hemoglobin 15.5; Platelets 317; Potassium 4.9; Sodium 140; TSH 1.580  Lipid Panel     Component Value Date/Time   CHOL 115 11/04/2017 0804   TRIG 74 11/04/2017 0804   HDL 36 (L) 11/04/2017 0804   CHOLHDL 3.2 11/04/2017 0804   VLDL 15 11/04/2017 0804   LDLCALC 64 11/04/2017 0804     Wt Readings from Last 3 Encounters:  12/02/17 246 lb 9.6 oz (111.9 kg)  11/04/17 244 lb 11.4 oz (111 kg)  10/13/17 252 lb 6.4 oz (114.5 kg)      Other studies Reviewed: Additional studies/ records that were reviewed today include: TEE 11/21/16 Review of the above records today demonstrates:  Normal mitral valve structure and mobility. Mild mitral regurgitation. Aortic Valve Normal tricuspid aortic valve with pliable leaflets, no stenosis or insufficiency. Tricuspid Valve Structurally normal tricuspid valve with no stenosis. Pulmonic Valve Normal pulmonic valve structure and mobility. Left Atrium Moderately dilated left atrium. Left Ventricle Normal left ventricle size and systolic function, with no segmental abnormality. Right Atrium Normal right atrium. Right Ventricle Normal right ventricle  structure and function. Pericardial Effusion No evidence of pericardial effusion. Pleural Effusion No evidence of pleural effusion. Miscellaneous The aorta is within normal limits. IVC is normal and collapses   ASSESSMENT AND PLAN:  1.  Paroxysmal atrial fibrillation: Had cryoablation 11/23/2016.  Had early return of atrial fibrillation and atrial flutter.  Is now status post repeat ablation 11/03/2017.  Currently on Xarelto. ***  This patients CHA2DS2-VASc Score and unadjusted Ischemic Stroke Rate (% per year) is equal to 2.2 % stroke rate/year from a score of 2  Above score calculated as 1 point each if present [CHF, HTN, DM, Vascular=MI/PAD/Aortic Plaque, Age if 65-74, or Male] Above score calculated as 2 points each if present [Age > 75, or Stroke/TIA/TE]   2. Hypertension: Controlled today.  No changes.***  3. Hyperlipidemia: Per primary cardiology, on Lipitor***   Current medicines are reviewed at length with the patient today.   The patient does not have concerns regarding his medicines.  The following changes were made today:  ***  Labs/ tests ordered today include:  No orders of the defined types were placed in this encounter.   Disposition:   FU with Noura Purpura *** months  Signed, Eriel Dunckel Jorja Loa, MD  02/23/2018 8:36 AM     [facility] [address] [place] [address] [phone] (office) [phone] (fax)

## 2018-03-16 ENCOUNTER — Ambulatory Visit: Payer: Medicare HMO | Admitting: Cardiology

## 2018-03-17 ENCOUNTER — Ambulatory Visit: Payer: Medicare HMO | Admitting: Cardiology

## 2018-03-22 ENCOUNTER — Encounter: Payer: Self-pay | Admitting: *Deleted

## 2018-03-26 ENCOUNTER — Ambulatory Visit: Payer: Medicare HMO | Admitting: Cardiology

## 2018-03-26 ENCOUNTER — Encounter (INDEPENDENT_AMBULATORY_CARE_PROVIDER_SITE_OTHER): Payer: Self-pay

## 2018-03-26 ENCOUNTER — Encounter: Payer: Self-pay | Admitting: Cardiology

## 2018-03-26 VITALS — BP 126/82 | HR 49 | Ht 69.0 in | Wt 260.6 lb

## 2018-03-26 DIAGNOSIS — I10 Essential (primary) hypertension: Secondary | ICD-10-CM

## 2018-03-26 DIAGNOSIS — I480 Paroxysmal atrial fibrillation: Secondary | ICD-10-CM

## 2018-03-26 DIAGNOSIS — E785 Hyperlipidemia, unspecified: Secondary | ICD-10-CM

## 2018-03-26 NOTE — Progress Notes (Signed)
Electrophysiology Office Note   Date:  03/26/2018   ID:  Jesus Mccullough Okelly, DOB [birth year], MRN [id number]  PCP:  [name], [name], NP  Cardiologist:  Jesus Mccullough Primary Electrophysiologist:  Dr [name]    CC: Follow up for atrial fibrillation   History of Present Illness: Jesus Mccullough [name] is a 67 y.o. male who is being seen today for the evaluation of atrial fibrillation at the request of [name], [name], NP. Presenting today for electrophysiology evaluation. He has a history of paroxysmal atrial fibrillation, diabetes, hypertension, and hyperlipidemia. He had ablation for atrial fibrillation at [facility]. He returned to clinic in October 2018 and was found to be in recurrent atrial fibrillation. He had cryoablation on 11/25/16.  He returned to clinic on 07/23/2017 and was in atrial flutter.  Had repeat ablation for atrial fibrillation and atrial flutter 11/03/2017.  Today, denies symptoms of palpitations, chest pain, shortness of breath, orthopnea, PND, lower extremity edema, claudication, dizziness, presyncope, syncope, bleeding, or neurologic sequela. The patient is tolerating medications without difficulties. He has not noted any symptoms of afib since his ablation. Overall, he feels well with a modest boost in his energy level.   Past Medical History:  Diagnosis Date  . Arrhythmia   . Diabetes mellitus due to underlying condition with unspecified complications (HCC) 12/16/2016  . Essential hypertension 12/16/2016  . Mixed dyslipidemia 12/16/2016  . Overweight 12/16/2016  . PAF (paroxysmal atrial fibrillation) (HCC) 12/16/2016   Past Surgical History:  Procedure Laterality Date  . ATRIAL FIBRILLATION ABLATION N/A 11/03/2017   Procedure: ATRIAL FIBRILLATION ABLATION;  Surgeon: Jesus Mccullough, Jesus Martin, MD;  Location: [facility];  Service: Cardiovascular;  Laterality: N/A;     Current Outpatient Medications  Medication Sig Dispense Refill  . allopurinol (ZYLOPRIM) 300 MG tablet  Take 300 mg by mouth daily.    Marland Kitchen. amiodarone (PACERONE) 200 MG tablet Take 200 mg by mouth daily.    . APPLE CIDER VINEGAR PO Take 1 capsule by mouth daily.    Marland Kitchen. atorvastatin (LIPITOR) 10 MG tablet Take 10 mg by mouth daily.     . Cholecalciferol (VITAMIN D3) 1000 units CAPS Take 1 capsule by mouth daily.    . empagliflozin (JARDIANCE) 25 MG TABS tablet Take 25 mg by mouth daily.    Marland Kitchen. gabapentin (NEURONTIN) 800 MG tablet Take 800 mg by mouth 3 (three) times daily.     Marland Kitchen. glipiZIDE (GLUCOTROL) 10 MG tablet Take 10 mg by mouth daily.    Marland Kitchen. levothyroxine (SYNTHROID, LEVOTHROID) 50 MCG tablet Take 50 mcg by mouth daily.  3  . lisinopril (PRINIVIL,ZESTRIL) 5 MG tablet Take 5 mg by mouth daily.     . metFORMIN (GLUCOPHAGE) 500 MG tablet Take 1,000 mg by mouth 2 (two) times daily.    . metoprolol tartrate (LOPRESSOR) 50 MG tablet Take 25 mg by mouth 2 (two) times daily.    . Omega-3 Fatty Acids (FISH OIL) 1000 MG CAPS Take 1,000 mg by mouth daily.     Marland Kitchen. omeprazole (PRILOSEC) 40 MG capsule Take 40 mg by mouth daily.    Marland Kitchen. POTASSIUM PO Take 1 tablet by mouth daily.    . tamsulosin (FLOMAX) 0.4 MG CAPS capsule Take 0.4 mg by mouth 2 (two) times daily.     Marland Kitchen. testosterone cypionate (DEPOTESTOSTERONE CYPIONATE) 200 MG/ML injection Inject 200 mg into the muscle every 14 (fourteen) days.     Jesus Mccullough. XARELTO 20 MG TABS tablet Take 20 mg by mouth daily.  No current facility-administered medications for this visit.     Allergies:   Antihistamines, chlorpheniramine-type; Aspirin; Doxylamine; and Nyquil multi-symptom [pseudoeph-doxylamine-dm-apap]   Social History:  The patient  reports that he has never smoked. He has never used smokeless tobacco. He reports that he does not drink alcohol or use drugs.   Family History:  The patient's family history includes Cancer in his brother and father; Heart attack in his maternal uncle; Heart disease in his maternal uncle and mother.   ROS:  Please see the history of  present illness.   All other systems are reviewed and negative.   PHYSICAL EXAM: VS:  BP 126/82   Pulse (!) 49   Ht 5\' 9"  (1.753 m)   Wt 260 lb 9.6 oz (118.2 kg)   SpO2 98%   BMI 38.48 kg/m  , BMI Body mass index is 38.48 kg/m. GEN: Well nourished, well developed, in no acute distress  HEENT: normal  Neck: no JVD, carotid bruits, or masses Cardiac: RRR; no murmurs, rubs, or gallops,no edema  Respiratory:  clear to auscultation bilaterally, normal work of breathing MS: no deformity or atrophy  Skin: warm and dry Neuro:  Strength and sensation are intact Psych: euthymic mood, full affect  EKG:  EKG is ordered today. Personal review of the ekg ordered shows sinus bradycardia with 1st degree AV block, slow Mccullough wave prog, LAD, PR 236, QRS 98, QTc419   Recent Labs: 10/13/2017: ALT 21; BUN 18; Creatinine, Ser 1.22; Hemoglobin 15.5; Platelets 317; Potassium 4.9; Sodium 140; TSH 1.580  Lipid Panel     Component Value Date/Time   CHOL 115 11/04/2017 0804   TRIG 74 11/04/2017 0804   HDL 36 (L) 11/04/2017 0804   CHOLHDL 3.2 11/04/2017 0804   VLDL 15 11/04/2017 0804   LDLCALC 64 11/04/2017 0804     Wt Readings from Last 3 Encounters:  03/26/18 260 lb 9.6 oz (118.2 kg)  12/02/17 246 lb 9.6 oz (111.9 kg)  11/04/17 244 lb 11.4 oz (111 kg)      Other studies Reviewed: Additional studies/ records that were reviewed today include: TEE 11/21/16 Review of the above records today demonstrates:  Normal mitral valve structure and mobility. Mild mitral regurgitation. Aortic Valve Normal tricuspid aortic valve with pliable leaflets, no stenosis or insufficiency. Tricuspid Valve Structurally normal tricuspid valve with no stenosis. Pulmonic Valve Normal pulmonic valve structure and mobility. Left Atrium Moderately dilated left atrium. Left Ventricle Normal left ventricle size and systolic function, with no segmental abnormality. Right Atrium Normal right atrium. Right  Ventricle Normal right ventricle structure and function. Pericardial Effusion No evidence of pericardial effusion. Pleural Effusion No evidence of pleural effusion. Miscellaneous The aorta is within normal limits. IVC is normal and collapses   ASSESSMENT AND PLAN:  1.  Paroxysmal atrial fibrillation/flutter: Cryoablation 11/23/2016.  On amiodarone and Xarelto.  Had a repeat ablation 11/03/2017 with also ablation of atrial flutter. He has done very well since then with no symptoms of afib. He is asymptomatic of his bradycardia. No changes today. Jesus consider stopping amiodarone at his next visit.  This patients CHA2DS2-VASc Score and unadjusted Ischemic Stroke Rate (% per year) is equal to 3.2 % stroke rate/year from a score of 3  Above score calculated as 1 point each if present [CHF, HTN, DM, Vascular=MI/PAD/Aortic Plaque, Age if 65-74, or Male] Above score calculated as 2 points each if present [Age > 75, or Stroke/TIA/TE]  2. Hypertension:  Well controlled today, no changes  3. Hyperlipidemia:  On Lipitor per primary cardiology  Current medicines are reviewed at length with the patient today.   The patient does not have concerns regarding his medicines.  The following changes were made today: none  Labs/ tests ordered today include:  Orders Placed This Encounter  Procedures  . EKG 12-Lead    Disposition:   FU with Jesus Camnitz 3 months  Signed, Jesus Jorja Loa, MD  03/26/2018 4:48 PM     [facility] [address] [place] [address] [phone] (office) [phone] (fax)   I have seen and examined this patient with Jorja Loa.  Agree with above, note added to reflect my findings.  On exam, RRR, no murmurs, lungs clear.  Status post ablation for atrial fibrillation.  He remains on amiodarone.  He has had minimal atrial fibrillation since his ablation.  At this point, we Jesus continue amiodarone and likely stop at the next visit.   Continue anticoagulation with Xarelto.  Jesus M. Camnitz MD 03/26/2018 4:48 PM

## 2018-03-26 NOTE — Patient Instructions (Signed)
Medication Instructions:    Your physician recommends that you continue on your current medications as directed. Please refer to the Current Medication list given to you today.  - If you need a refill on your cardiac medications before your next appointment, please call your pharmacy.   Labwork:  None ordered  Testing/Procedures:  None ordered  Follow-Up:  Your physician recommends that you schedule a follow-up appointment in: 3 months with Dr. Camnitz.  Thank you for choosing [facility]!!   Azaryah Heathcock, RN [phone]         

## 2018-07-02 ENCOUNTER — Telehealth: Payer: Self-pay | Admitting: Cardiology

## 2018-07-02 ENCOUNTER — Ambulatory Visit: Payer: Medicare HMO | Admitting: Cardiology

## 2018-07-02 NOTE — Telephone Encounter (Signed)
Home VS reported:  BP 132/70,  HR 47, weight 262 lb  Stopping Amiodarone 6 mo recall place in [facility]

## 2018-07-02 NOTE — Addendum Note (Signed)
Addended by: Baird Lyons on: 07/02/2018 01:40 PM   Modules accepted: Orders

## 2018-07-02 NOTE — Telephone Encounter (Signed)
Virtual visit with the patient today over the telephone.  The patient is currently feeling well without complaint.  He is remained in sinus rhythm without issue.  Due to that, we will plan to stop his amiodarone today.  He was instructed to call us back if he has any further issues.  We will see him back in 6 months.  Loman Brooklyn, MD

## 2018-07-02 NOTE — Telephone Encounter (Signed)
I had a telephone visit with [name] today.  He is feeling well without major complaint.  He is noted no further episodes of atrial fibrillation or atrial flutter.  He is able to do all of his daily activities.  Due to his lack of arrhythmia, Jesus Mccullough plan to stop his amiodarone.  We Jesus Hoppes continue his other home medications without changes at this time.  His blood pressure is well controlled at home.  Blood pressure is 132/70 with a heart rate of 47.  I Jesus Mccullough see him back in clinic in 6 months.  Jesus Brooklyn, MD

## 2019-01-03 ENCOUNTER — Ambulatory Visit: Payer: Medicare HMO | Admitting: Student

## 2019-01-28 ENCOUNTER — Ambulatory Visit (INDEPENDENT_AMBULATORY_CARE_PROVIDER_SITE_OTHER): Payer: Medicare HMO | Admitting: Cardiology

## 2019-01-28 ENCOUNTER — Other Ambulatory Visit: Payer: Self-pay

## 2019-01-28 ENCOUNTER — Encounter: Payer: Self-pay | Admitting: Cardiology

## 2019-01-28 VITALS — BP 142/78 | HR 56 | Ht 69.0 in | Wt 247.4 lb

## 2019-01-28 DIAGNOSIS — E782 Mixed hyperlipidemia: Secondary | ICD-10-CM

## 2019-01-28 DIAGNOSIS — I480 Paroxysmal atrial fibrillation: Secondary | ICD-10-CM

## 2019-01-28 DIAGNOSIS — I10 Essential (primary) hypertension: Secondary | ICD-10-CM | POA: Diagnosis not present

## 2019-01-28 DIAGNOSIS — Z7901 Long term (current) use of anticoagulants: Secondary | ICD-10-CM

## 2019-01-28 DIAGNOSIS — E088 Diabetes mellitus due to underlying condition with unspecified complications: Secondary | ICD-10-CM

## 2019-01-28 NOTE — Progress Notes (Signed)
Cardiology Office Note:    Date:  01/28/2019   ID:  Jesus Mccullough, DOB 10-12-1950, MRN [id number]  PCP:  Jesus SeverinYork, [name], NP  Cardiologist:  Jesus Brothersajan R Tawana Pasch, MD   Referring MD: Jesus SeverinYork, [name], NP    ASSESSMENT:    1. Essential hypertension   2. PAF (paroxysmal atrial fibrillation) (HCC)   3. Paroxysmal atrial fibrillation (HCC)   4. On rivaroxaban therapy   5. Mixed dyslipidemia   6. Diabetes mellitus due to underlying condition with unspecified complications (HCC)    PLAN:    In order of problems listed above:  1. Paroxysmal atrial fibrillation post ablation:I discussed with the patient atrial fibrillation, disease process. Management and therapy including rate and rhythm control, anticoagulation benefits and potential risks were discussed extensively with the patient. Patient had multiple questions which were answered to patient's satisfaction. 2. Essential hypertension: Blood pressure stable 3. Mixed dyslipidemia and diabetes mellitus: Lipids were reviewed with him extensively diet was discussed.  I urged him to lose weight.  Risks of obesity explained and he vocalized understanding.Patient will be seen in follow-up appointment in 6 months or earlier if the patient has any concerns    Medication Adjustments/Labs and Tests Ordered: Current medicines are reviewed at length with the patient today.  Concerns regarding medicines are outlined above.  Orders Placed This Encounter  Procedures  . EKG 12-Lead   No orders of the defined types were placed in this encounter.    No chief complaint on file.    History of Present Illness:    Jesus Mccullough is a 68 y.o. male.  Patient has past medical history of atrial fibrillation and underwent ablation.  Subsequently has not experienced any palpitations.  No chest pain orthopnea or PND.  He is walking some on a regular basis.  At the time of my evaluation, the patient is alert awake oriented and in no distress.  Past Medical  History:  Diagnosis Date  . Arrhythmia   . Diabetes mellitus due to underlying condition with unspecified complications (HCC) 12/16/2016  . Essential hypertension 12/16/2016  . Mixed dyslipidemia 12/16/2016  . Overweight 12/16/2016  . PAF (paroxysmal atrial fibrillation) (HCC) 12/16/2016    Past Surgical History:  Procedure Laterality Date  . ATRIAL FIBRILLATION ABLATION N/A 11/03/2017   Procedure: ATRIAL FIBRILLATION ABLATION;  Surgeon: Regan Lemmingamnitz, Will Martin, MD;  Location: [facility];  Service: Cardiovascular;  Laterality: N/A;    Current Medications: Current Meds  Medication Sig  . allopurinol (ZYLOPRIM) 300 MG tablet Take 300 mg by mouth daily.  . APPLE CIDER VINEGAR PO Take 1 capsule by mouth daily.  Marland Kitchen. atorvastatin (LIPITOR) 10 MG tablet Take 10 mg by mouth daily.   . Cholecalciferol (VITAMIN D3) 1000 units CAPS Take 1 capsule by mouth daily.  . empagliflozin (JARDIANCE) 25 MG TABS tablet Take 25 mg by mouth daily.  Marland Kitchen. gabapentin (NEURONTIN) 600 MG tablet Take 1,200 mg by mouth 2 (two) times daily.  Marland Kitchen. glipiZIDE (GLUCOTROL) 10 MG tablet Take 10 mg by mouth daily.  Marland Kitchen. levothyroxine (SYNTHROID, LEVOTHROID) 50 MCG tablet Take 50 mcg by mouth daily.  Marland Kitchen. lisinopril (PRINIVIL,ZESTRIL) 5 MG tablet Take 5 mg by mouth daily.   . metFORMIN (GLUCOPHAGE) 500 MG tablet Take 1,000 mg by mouth 2 (two) times daily.  . metoprolol tartrate (LOPRESSOR) 50 MG tablet Take 25 mg by mouth 2 (two) times daily.  . Omega-3 Fatty Acids (FISH OIL) 1000 MG CAPS Take 1,000 mg by mouth daily.   .Marland Kitchen  omeprazole (PRILOSEC) 40 MG capsule Take 40 mg by mouth 2 (two) times daily.   Marland Kitchen POTASSIUM PO Take 1 tablet by mouth daily.  . tamsulosin (FLOMAX) 0.4 MG CAPS capsule Take 0.4 mg by mouth 2 (two) times daily.   Marland Kitchen testosterone cypionate (DEPOTESTOSTERONE CYPIONATE) 200 MG/ML injection Inject 200 mg into the muscle every 14 (fourteen) days.   [name] 20 MG TABS tablet Take 20 mg by mouth daily.     Allergies:    Antihistamines, chlorpheniramine-type; Aspirin; Doxylamine; and Nyquil multi-symptom [pseudoeph-doxylamine-dm-apap]   Social History   Socioeconomic History  . Marital status: Widowed    Spouse name: Not on file  . Number of children: Not on file  . Years of education: Not on file  . Highest education level: Not on file  Occupational History  . Not on file  Social Needs  . Financial resource strain: Not on file  . Food insecurity    Worry: Not on file    Inability: Not on file  . Transportation needs    Medical: Not on file    Non-medical: Not on file  Tobacco Use  . Smoking status: Never Smoker  . Smokeless tobacco: Never Used  Substance and Sexual Activity  . Alcohol use: No  . Drug use: No  . Sexual activity: Not on file  Lifestyle  . Physical activity    Days per week: Not on file    Minutes per session: Not on file  . Stress: Not on file  Relationships  . Social Musician on phone: Not on file    Gets together: Not on file    Attends religious service: Not on file    Active member of club or organization: Not on file    Attends meetings of clubs or organizations: Not on file    Relationship status: Not on file  Other Topics Concern  . Not on file  Social History Narrative  . Not on file     Family History: The patient's family history includes Cancer in his brother and father; Heart attack in his maternal uncle; Heart disease in his maternal uncle and mother.  ROS:   Please see the history of present illness.    All other systems reviewed and are negative.  EKGs/Labs/Other Studies Reviewed:    The following studies were reviewed today: EKG reveals sinus rhythm first-degree AV block and nonspecific ST-T changes.   Recent Labs: No results found for requested labs within last 8760 hours.  Recent Lipid Panel    Component Value Date/Time   CHOL 115 11/04/2017 0804   TRIG 74 11/04/2017 0804   HDL 36 (L) 11/04/2017 0804   CHOLHDL 3.2  11/04/2017 0804   VLDL 15 11/04/2017 0804   LDLCALC 64 11/04/2017 0804    Physical Exam:    VS:  BP (!) 142/78 (BP Location: Right Arm, Patient Position: Sitting, Cuff Size: Normal)   Pulse (!) 56   Ht 5\' 9"  (1.753 m)   Wt 247 lb 6.4 oz (112.2 kg)   SpO2 94%   BMI 36.53 kg/m     Wt Readings from Last 3 Encounters:  01/28/19 247 lb 6.4 oz (112.2 kg)  03/26/18 260 lb 9.6 oz (118.2 kg)  12/02/17 246 lb 9.6 oz (111.9 kg)     GEN: Patient is in no acute distress HEENT: Normal NECK: No JVD; No carotid bruits LYMPHATICS: No lymphadenopathy CARDIAC: Hear sounds regular, 2/6 systolic murmur at the apex. RESPIRATORY:  Clear to auscultation without rales, wheezing or rhonchi  ABDOMEN: Soft, non-tender, non-distended MUSCULOSKELETAL:  No edema; No deformity  SKIN: Warm and dry NEUROLOGIC:  Alert and oriented x 3 PSYCHIATRIC:  Normal affect   Signed, Jenean Lindau, MD  01/28/2019 2:37 PM    [place]

## 2019-01-28 NOTE — Patient Instructions (Signed)

## 2019-08-18 DIAGNOSIS — E291 Testicular hypofunction: Secondary | ICD-10-CM | POA: Diagnosis not present

## 2019-09-01 DIAGNOSIS — E291 Testicular hypofunction: Secondary | ICD-10-CM | POA: Diagnosis not present

## 2019-09-15 DIAGNOSIS — E291 Testicular hypofunction: Secondary | ICD-10-CM | POA: Diagnosis not present

## 2019-09-15 DIAGNOSIS — R197 Diarrhea, unspecified: Secondary | ICD-10-CM | POA: Diagnosis not present

## 2019-09-15 DIAGNOSIS — K219 Gastro-esophageal reflux disease without esophagitis: Secondary | ICD-10-CM | POA: Diagnosis not present

## 2019-09-15 DIAGNOSIS — M25512 Pain in left shoulder: Secondary | ICD-10-CM | POA: Diagnosis not present

## 2019-09-20 DIAGNOSIS — Z79899 Other long term (current) drug therapy: Secondary | ICD-10-CM | POA: Diagnosis not present

## 2019-09-20 DIAGNOSIS — E039 Hypothyroidism, unspecified: Secondary | ICD-10-CM | POA: Diagnosis not present

## 2019-09-20 DIAGNOSIS — M25512 Pain in left shoulder: Secondary | ICD-10-CM | POA: Diagnosis not present

## 2019-09-20 DIAGNOSIS — E785 Hyperlipidemia, unspecified: Secondary | ICD-10-CM | POA: Diagnosis not present

## 2019-09-20 DIAGNOSIS — Z6835 Body mass index (BMI) 35.0-35.9, adult: Secondary | ICD-10-CM | POA: Diagnosis not present

## 2019-09-20 DIAGNOSIS — E291 Testicular hypofunction: Secondary | ICD-10-CM | POA: Diagnosis not present

## 2019-09-20 DIAGNOSIS — E1140 Type 2 diabetes mellitus with diabetic neuropathy, unspecified: Secondary | ICD-10-CM | POA: Diagnosis not present

## 2019-09-20 DIAGNOSIS — M109 Gout, unspecified: Secondary | ICD-10-CM | POA: Diagnosis not present

## 2019-09-20 DIAGNOSIS — E118 Type 2 diabetes mellitus with unspecified complications: Secondary | ICD-10-CM | POA: Diagnosis not present

## 2019-09-22 ENCOUNTER — Ambulatory Visit: Payer: Medicare HMO | Admitting: Cardiology

## 2019-09-26 DIAGNOSIS — R109 Unspecified abdominal pain: Secondary | ICD-10-CM | POA: Diagnosis not present

## 2019-09-28 ENCOUNTER — Other Ambulatory Visit: Payer: Self-pay

## 2019-09-28 ENCOUNTER — Ambulatory Visit: Payer: Medicare HMO | Admitting: Cardiology

## 2019-09-29 DIAGNOSIS — E291 Testicular hypofunction: Secondary | ICD-10-CM | POA: Diagnosis not present

## 2019-10-07 DIAGNOSIS — H6123 Impacted cerumen, bilateral: Secondary | ICD-10-CM | POA: Diagnosis not present

## 2019-10-07 DIAGNOSIS — H903 Sensorineural hearing loss, bilateral: Secondary | ICD-10-CM | POA: Diagnosis not present

## 2019-10-07 DIAGNOSIS — H9202 Otalgia, left ear: Secondary | ICD-10-CM | POA: Diagnosis not present

## 2019-10-09 IMAGING — CT CT HEART MORPH/PULM VEIN W/ CM & W/O CA SCORE
3 of 6 series · 11 of 20 positions shown, 13 images · non-contrast
Comparison: 06/14/2016

CLINICAL DATA: Atrial fibrillation scheduled for an ablation.

EXAM:
Cardiac CT/CTA
TECHNIQUE: The patient was scanned on a Siemens Force  scanner.

[Series 6: best diast · axial · 0.44mm/px · z∈[+1122,+1212]mm · 4 of 375 slices shown]
[im 75/375  vessel]
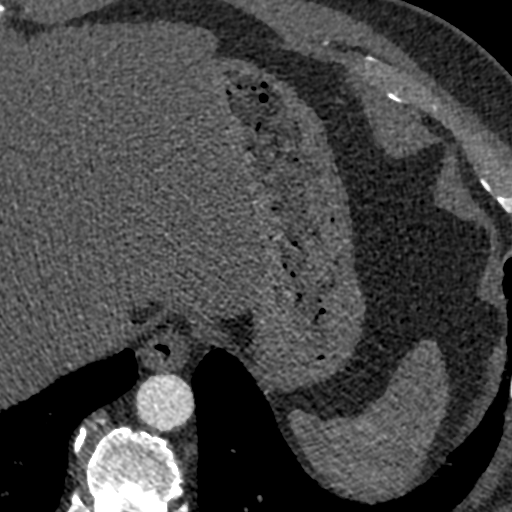
[im 150/375  vessel]
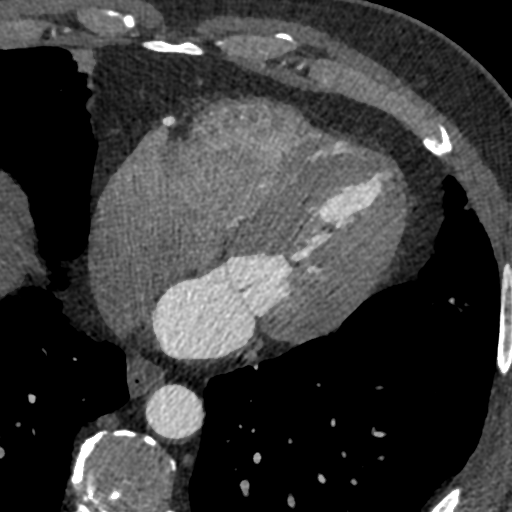
[im 225/375  vessel]
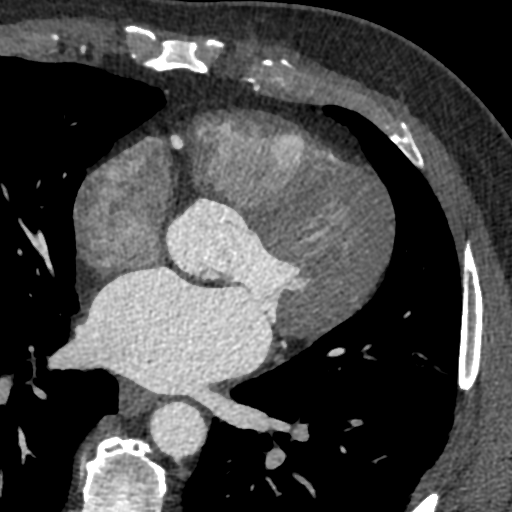
[im 300/375  vessel]
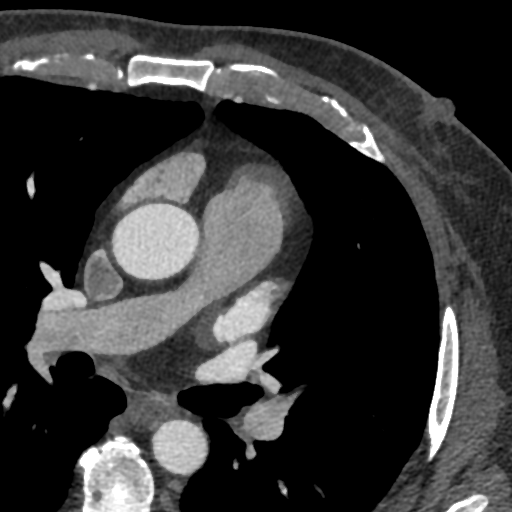

[Series 7: +300 ms · axial · 0.44mm/px · z∈[+1117,+1217]mm · 5 of 375 slices shown, 7 images]
[im 63/375  vessel]
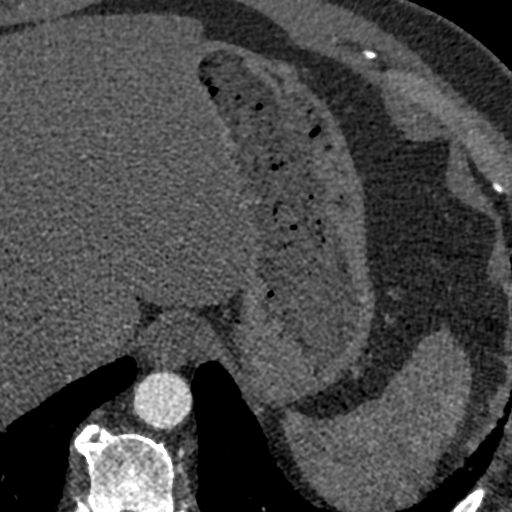
[im 63/375  lung]
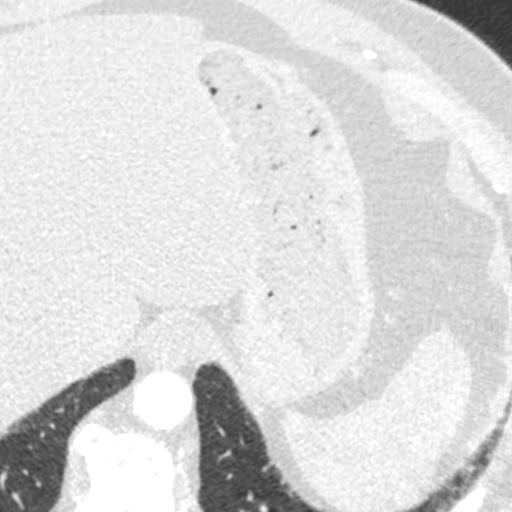
[im 125/375  vessel]
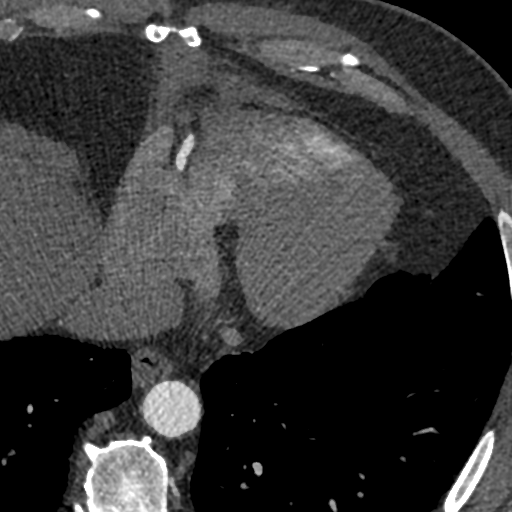
[im 188/375  vessel]
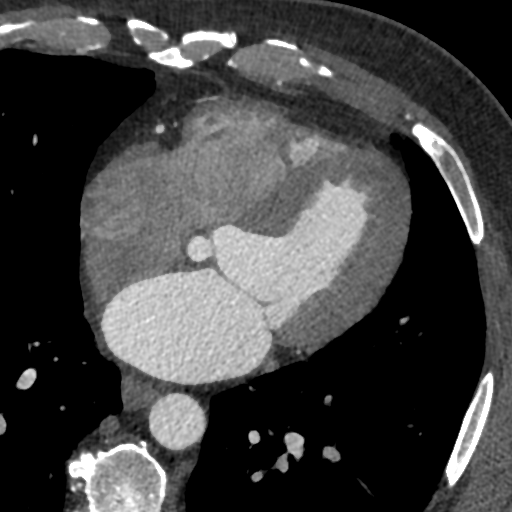
[im 250/375  vessel]
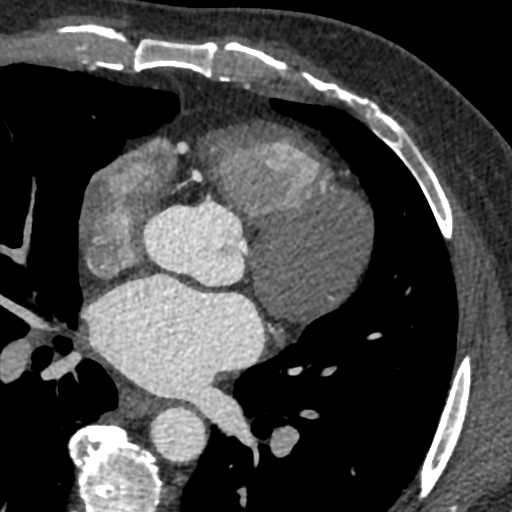
[im 312/375  vessel]
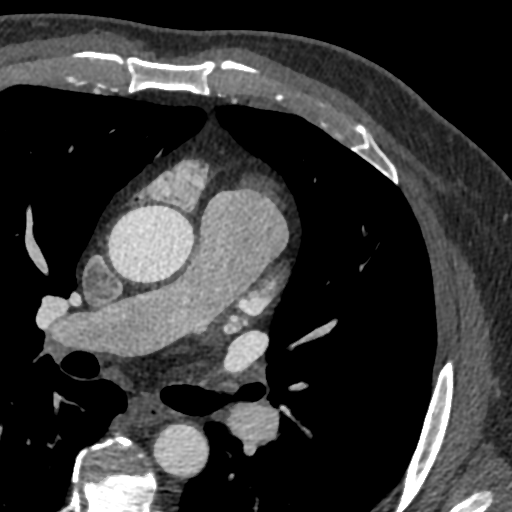
[im 312/375  lung]
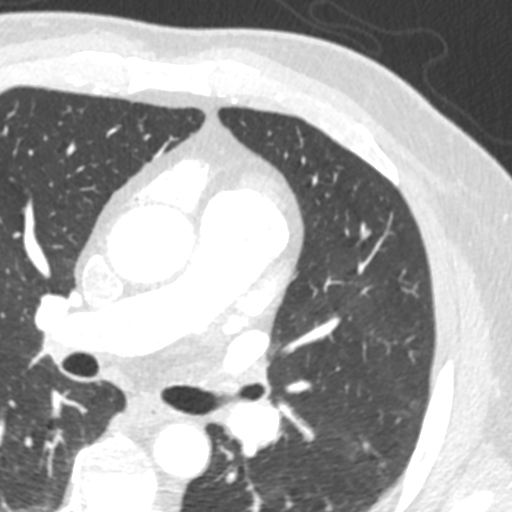

[Series 14: +(id) delay · axial · delayed · 0.44mm/px · z∈[+1177,+1210]mm · 2 of 201 slices shown]
[im 67/201  vessel]
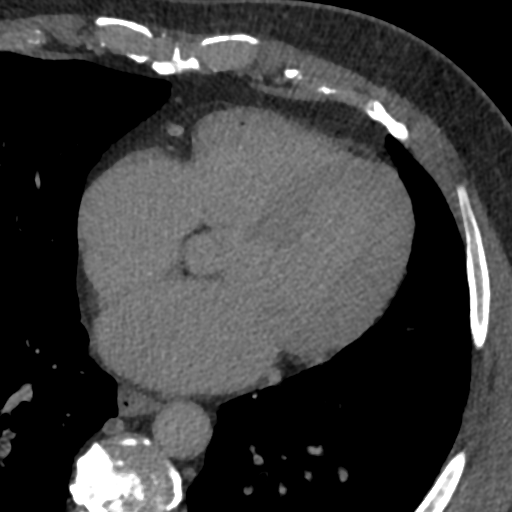
[im 134/201  vessel]
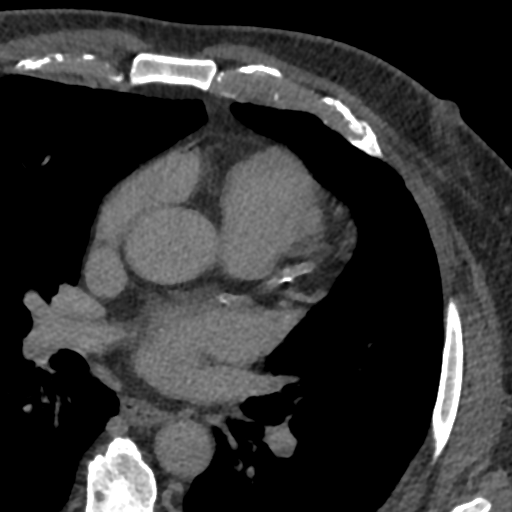

[11 of 20 positions shown; findings below may reference images not displayed]

FINDINGS: A 120 kV prospective scan was triggered in the descending thoracic
aorta at 111 HU's. Gantry rotation speed was 280 msecs and
collimation was .9 mm. No beta blockade and no NTG was given. The 3D
data set was reconstructed in 5% intervals of the 60-80 % of the R-R
cycle. Diastolic phases were analyzed on a dedicated work station
using MPR, MIP and VRT modes. The patient received 80 cc of
contrast.

There is normal pulmonary vein drainage into the left atrium (2 on
the right and 2 on the left) with ostial measurements as follows:
The right lower PV had early bifurcation of two branches

RUPV: Ostium 17.4 mm Area 1.65 cm2

RLPV: Ostium 18.3 mm Area 2.7 cm2

LUPV: Ostium 20.3 mm Area 2.3 cm2

LLPV: Ostium 18.3 mm Area 1.8 cm2

No CHAI thrombus.  No ASD/PFO Moderate AKUA, Mild LAE .

The esophagus runs closest to the LLPV ostium

Aorta:  Normal caliber.  3.6 cm

Coronary Arteries: Normal coronary origin. Calcium noted in the RCA
and LAD
IMPRESSION: 1.  No CHAI thrombus

2.  Moderate AKUA, Mild LAE

3.  No ASD/PFO

4.  Normal aortic root 3.6 cm

5.  No pericardial effusion

6. Coronary Calcium Score 367 which is 73 [REDACTED] percentile for age and
sex

7. Normal PV anatomy measurements as above with early bifurcation
into two large branches of DANII

Rachana Kizer

EXAM:
OVER-READ INTERPRETATION  CT CHEST

The following report is an over-read performed by radiologist Dr.
Rudmar Fiens [REDACTED] on 10/27/2017. This over-read
does not include interpretation of cardiac or coronary anatomy or
pathology. The coronary CTA interpretation by the cardiologist is
attached.
FINDINGS: Vascular: Heart is normal size.  Aorta is normal caliber.

Mediastinum/Nodes: No adenopathy in the lower mediastinum or hila.

Lungs/Pleura: No confluent airspace opacities or effusions.

Upper Abdomen: Imaging into the upper abdomen shows no acute
findings.

Musculoskeletal: Chest wall soft tissues are unremarkable. No acute
bony abnormality.
IMPRESSION: No acute or significant extracardiac abnormality.

## 2019-10-13 DIAGNOSIS — E291 Testicular hypofunction: Secondary | ICD-10-CM | POA: Diagnosis not present

## 2019-10-27 DIAGNOSIS — E291 Testicular hypofunction: Secondary | ICD-10-CM | POA: Diagnosis not present

## 2019-11-08 ENCOUNTER — Ambulatory Visit: Payer: Medicare HMO | Admitting: Cardiology

## 2019-11-08 ENCOUNTER — Other Ambulatory Visit: Payer: Self-pay

## 2019-11-08 ENCOUNTER — Encounter: Payer: Self-pay | Admitting: Cardiology

## 2019-11-08 VITALS — BP 120/72 | HR 75 | Ht 69.0 in | Wt 243.0 lb

## 2019-11-08 DIAGNOSIS — Z7901 Long term (current) use of anticoagulants: Secondary | ICD-10-CM

## 2019-11-08 DIAGNOSIS — I480 Paroxysmal atrial fibrillation: Secondary | ICD-10-CM

## 2019-11-08 DIAGNOSIS — E088 Diabetes mellitus due to underlying condition with unspecified complications: Secondary | ICD-10-CM

## 2019-11-08 DIAGNOSIS — E782 Mixed hyperlipidemia: Secondary | ICD-10-CM | POA: Diagnosis not present

## 2019-11-08 DIAGNOSIS — I10 Essential (primary) hypertension: Secondary | ICD-10-CM

## 2019-11-08 NOTE — Progress Notes (Signed)
Cardiology Office Note:    Date:  11/08/2019   ID:  Jesus Mccullough, DOB 20-Jun-1950, MRN [id number]  PCP:  Eunice Blase, PA-C  Cardiologist:  Garwin Brothers, MD   Referring MD: [name], NP    ASSESSMENT:    1. Essential hypertension   2. PAF (paroxysmal atrial fibrillation) (HCC)   3. Diabetes mellitus due to underlying condition with unspecified complications (HCC)   4. Mixed dyslipidemia   5. On rivaroxaban therapy    PLAN:    In order of problems listed above:  1. Primary prevention stressed with the patient.  Importance of compliance with diet medication stressed and vocalized understanding. 2. Essential hypertension: Blood pressure stable diet was emphasized 3. Paroxysmal atrial fibrillation: I discussed with the patient atrial fibrillation, disease process. Management and therapy including rate and rhythm control, anticoagulation benefits and potential risks were discussed extensively with the patient. Patient had multiple questions which were answered to patient's satisfaction. 4. Diabetes mellitus and dyslipidemia: Diet was emphasized at extensive length and patient is agreeable for a better diet and exercise.  Weight reduction was stressed abdominal obesity and risks were explained and he promises to do better. 5. Patient will be seen in follow-up appointment in 6 months or earlier if the patient has any concerns    Medication Adjustments/Labs and Tests Ordered: Current medicines are reviewed at length with the patient today.  Concerns regarding medicines are outlined above.  No orders of the defined types were placed in this encounter.  No orders of the defined types were placed in this encounter.    No chief complaint on file.    History of Present Illness:    Jesus Mccullough is a 69 y.o. male.  Patient has past medical history of essential hypertension dyslipidemia diabetes mellitus and paroxysmal atrial fibrillation.  He denies any problems at this  time and takes care of activities of daily living.  No chest pain orthopnea or PND.  At the time of my evaluation, the patient is alert awake oriented and in no distress.  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   Diabetes mellitus due to underlying condition with unspecified complications (HCC) 12/16/2016   Erectile dysfunction 01/09/2016   Essential hypertension 12/16/2016   Hypogonadism in male 01/09/2016   Mixed dyslipidemia 12/16/2016   On rivaroxaban therapy 06/23/2017   Overweight 12/16/2016   PAF (paroxysmal atrial fibrillation) (HCC) 12/16/2016   Paroxysmal atrial fibrillation (HCC) 11/03/2017   Peroneal tendinitis of right lower extremity 11/22/2015   Syncope due to orthostatic hypotension 06/23/2017    Past Surgical History:  Procedure Laterality Date   ATRIAL FIBRILLATION ABLATION N/A 11/03/2017   Procedure: ATRIAL FIBRILLATION ABLATION;  Surgeon: [name], MD;  Location: [facility];  Service: Cardiovascular;  Laterality: N/A;    Current Medications: Current Meds  Medication Sig   allopurinol (ZYLOPRIM) 300 MG tablet Take 300 mg by mouth daily.   APPLE CIDER VINEGAR PO Take 1 capsule by mouth daily.   atorvastatin (LIPITOR) 10 MG tablet Take 10 mg by mouth daily.    Cholecalciferol (VITAMIN D3) 1000 units CAPS Take 1 capsule by mouth daily.   dicyclomine (BENTYL) 10 MG capsule Take 1 capsule by mouth daily.   donepezil (ARICEPT) 10 MG tablet Take 10 mg by mouth daily.   dutasteride (AVODART)  0.5 MG capsule Take 0.5 mg by mouth daily.    empagliflozin (JARDIANCE) 25 MG TABS tablet Take 25 mg by mouth daily.   gabapentin (NEURONTIN) 600 MG tablet Take 1,200 mg by mouth 2 (two) times daily.   glipiZIDE (GLUCOTROL) 10 MG tablet Take 10 mg by mouth daily.   levothyroxine (SYNTHROID) 75 MCG tablet  Take 75 mcg by mouth daily.    lisinopril (PRINIVIL,ZESTRIL) 5 MG tablet Take 5 mg by mouth daily.    metFORMIN (GLUCOPHAGE) 500 MG tablet Take 1,000 mg by mouth 2 (two) times daily.   methocarbamol (ROBAXIN) 500 MG tablet Take 500 mg by mouth every 6 (six) hours as needed.    metoprolol tartrate (LOPRESSOR) 50 MG tablet Take 25 mg by mouth 2 (two) times daily.   Omega-3 Fatty Acids (FISH OIL) 1000 MG CAPS Take 1,000 mg by mouth daily.    omeprazole (PRILOSEC) 40 MG capsule Take 40 mg by mouth 2 (two) times daily.    oxyCODONE (OXY IR/ROXICODONE) 5 MG immediate release tablet    POTASSIUM PO Take 1 tablet by mouth daily.   tamsulosin (FLOMAX) 0.4 MG CAPS capsule Take 0.4 mg by mouth daily.    testosterone cypionate (DEPOTESTOSTERONE CYPIONATE) 200 MG/ML injection Inject 200 mg into the muscle every 14 (fourteen) days.    XARELTO 20 MG TABS tablet Take 20 mg by mouth daily.     Allergies:   Antihistamines, chlorpheniramine-type; Aspirin; Doxylamine; and Nyquil multi-symptom [pseudoeph-doxylamine-dm-apap]   Social History   Socioeconomic History   Marital status: Widowed    Spouse name: Not on file   Number of children: Not on file   Years of education: Not on file   Highest education level: Not on file  Occupational History   Not on file  Tobacco Use   Smoking status: Never Smoker   Smokeless tobacco: Never Used  Vaping Use   Vaping Use: Never used  Substance and Sexual Activity   Alcohol use: No   Drug use: No   Sexual activity: Not on file  Other Topics Concern   Not on file  Social History Narrative   Not on file   Social Determinants of Health   Financial Resource Strain:    Difficulty of Paying Living Expenses:   Food Insecurity:    Worried About Running Out of Food in the Last Year:    Barista in the Last Year:   Transportation Needs:    Freight forwarder (Medical):    Lack of Transportation (Non-Medical):   Physical  Activity:    Days of Exercise per Week:    Minutes of Exercise per Session:   Stress:    Feeling of Stress :   Social Connections:    Frequency of Communication with Friends and Family:    Frequency of Social Gatherings with Friends and Family:    Attends Religious Services:    Active Member of Clubs or Organizations:    Attends Banker Meetings:    Marital Status:      Family History: The patient's family history includes Cancer in his brother and father; Heart attack in his maternal uncle; Heart disease in his maternal uncle and mother.  ROS:   Please see the history of present illness.    All other systems reviewed and are negative.  EKGs/Labs/Other Studies Reviewed:    The following studies were reviewed today: EKG reveals sinus rhythm poor anterior forces and nonspecific ST-T changes   Recent Labs: No  results found for requested labs within last 8760 hours.  Recent Lipid Panel    Component Value Date/Time   CHOL 115 11/04/2017 0804   TRIG 74 11/04/2017 0804   HDL 36 (L) 11/04/2017 0804   CHOLHDL 3.2 11/04/2017 0804   VLDL 15 11/04/2017 0804   LDLCALC 64 11/04/2017 0804    Physical Exam:    VS:  BP 120/72    Pulse 75    Ht 5\' 9"  (1.753 m)    Wt (!) 243 lb (110.2 kg)    SpO2 94%    BMI 35.88 kg/m     Wt Readings from Last 3 Encounters:  11/08/19 (!) 243 lb (110.2 kg)  01/28/19 247 lb 6.4 oz (112.2 kg)  03/26/18 260 lb 9.6 oz (118.2 kg)     GEN: Patient is in no acute distress HEENT: Normal NECK: No JVD; No carotid bruits LYMPHATICS: No lymphadenopathy CARDIAC: Hear sounds regular, 2/6 systolic murmur at the apex. RESPIRATORY:  Clear to auscultation without rales, wheezing or rhonchi  ABDOMEN: Soft, non-tender, non-distended MUSCULOSKELETAL:  No edema; No deformity  SKIN: Warm and dry NEUROLOGIC:  Alert and oriented x 3 PSYCHIATRIC:  Normal affect   Signed, 03/28/18, MD  11/08/2019 1:48 PM    [facility]

## 2019-11-08 NOTE — Patient Instructions (Signed)
Medication Instructions:  Your physician recommends that you continue on your current medications as directed. Please refer to the Current Medication list given to you today.  *If you need a refill on your cardiac medications before your next appointment, please call your pharmacy*   Lab Work: None   If you have labs (blood work) drawn today and your tests are completely normal, you will receive your results only by: Marland Kitchen MyChart Message (if you have MyChart) OR . A paper copy in the mail If you have any lab test that is abnormal or we need to change your treatment, we will call you to review the results.   Testing/Procedures:    Follow-Up: At [facility], you and your health needs are our priority.  As part of our continuing mission to provide you with exceptional heart care, we have created designated Provider Care Teams.  These Care Teams include your primary Cardiologist (physician) and Advanced Practice Providers (APPs -  Physician Assistants and Nurse Practitioners) who all work together to provide you with the care you need, when you need it.  We recommend signing up for the patient portal called "MyChart".  Sign up information is provided on this After Visit Summary.  MyChart is used to connect with patients for Virtual Visits (Telemedicine).  Patients are able to view lab/test results, encounter notes, upcoming appointments, etc.  Non-urgent messages can be sent to your provider as well.   To learn more about what you can do with MyChart, go to [email or web address].    Your next appointment:   6 month(s)  The format for your next appointment:   In Person  Provider:   Belva Crome, MD   Other Instructions None

## 2019-11-10 DIAGNOSIS — E291 Testicular hypofunction: Secondary | ICD-10-CM | POA: Diagnosis not present

## 2019-11-17 DIAGNOSIS — Z20822 Contact with and (suspected) exposure to covid-19: Secondary | ICD-10-CM | POA: Diagnosis not present

## 2019-11-24 DIAGNOSIS — E291 Testicular hypofunction: Secondary | ICD-10-CM | POA: Diagnosis not present

## 2019-12-08 DIAGNOSIS — I10 Essential (primary) hypertension: Secondary | ICD-10-CM | POA: Diagnosis not present

## 2019-12-08 DIAGNOSIS — K8000 Calculus of gallbladder with acute cholecystitis without obstruction: Secondary | ICD-10-CM | POA: Diagnosis not present

## 2019-12-08 DIAGNOSIS — I482 Chronic atrial fibrillation, unspecified: Secondary | ICD-10-CM | POA: Diagnosis not present

## 2019-12-08 DIAGNOSIS — R1031 Right lower quadrant pain: Secondary | ICD-10-CM | POA: Diagnosis not present

## 2019-12-08 DIAGNOSIS — E119 Type 2 diabetes mellitus without complications: Secondary | ICD-10-CM | POA: Diagnosis not present

## 2019-12-08 DIAGNOSIS — G4730 Sleep apnea, unspecified: Secondary | ICD-10-CM | POA: Diagnosis not present

## 2019-12-08 DIAGNOSIS — E039 Hypothyroidism, unspecified: Secondary | ICD-10-CM | POA: Diagnosis not present

## 2019-12-08 DIAGNOSIS — R1110 Vomiting, unspecified: Secondary | ICD-10-CM | POA: Diagnosis not present

## 2019-12-08 DIAGNOSIS — R109 Unspecified abdominal pain: Secondary | ICD-10-CM | POA: Diagnosis not present

## 2019-12-08 DIAGNOSIS — Z9989 Dependence on other enabling machines and devices: Secondary | ICD-10-CM | POA: Diagnosis not present

## 2019-12-08 DIAGNOSIS — Z79899 Other long term (current) drug therapy: Secondary | ICD-10-CM | POA: Diagnosis not present

## 2019-12-08 DIAGNOSIS — K8010 Calculus of gallbladder with chronic cholecystitis without obstruction: Secondary | ICD-10-CM | POA: Diagnosis not present

## 2019-12-08 DIAGNOSIS — K819 Cholecystitis, unspecified: Secondary | ICD-10-CM | POA: Diagnosis not present

## 2019-12-08 DIAGNOSIS — F0390 Unspecified dementia without behavioral disturbance: Secondary | ICD-10-CM | POA: Diagnosis not present

## 2019-12-08 DIAGNOSIS — R0600 Dyspnea, unspecified: Secondary | ICD-10-CM | POA: Diagnosis not present

## 2019-12-08 DIAGNOSIS — G4733 Obstructive sleep apnea (adult) (pediatric): Secondary | ICD-10-CM | POA: Diagnosis not present

## 2019-12-08 DIAGNOSIS — E1140 Type 2 diabetes mellitus with diabetic neuropathy, unspecified: Secondary | ICD-10-CM | POA: Diagnosis not present

## 2019-12-08 DIAGNOSIS — N400 Enlarged prostate without lower urinary tract symptoms: Secondary | ICD-10-CM | POA: Diagnosis not present

## 2019-12-08 DIAGNOSIS — K8020 Calculus of gallbladder without cholecystitis without obstruction: Secondary | ICD-10-CM | POA: Diagnosis not present

## 2019-12-08 DIAGNOSIS — K810 Acute cholecystitis: Secondary | ICD-10-CM | POA: Diagnosis not present

## 2019-12-08 DIAGNOSIS — Z7952 Long term (current) use of systemic steroids: Secondary | ICD-10-CM | POA: Diagnosis not present

## 2019-12-08 DIAGNOSIS — K760 Fatty (change of) liver, not elsewhere classified: Secondary | ICD-10-CM | POA: Diagnosis not present

## 2019-12-08 DIAGNOSIS — I4891 Unspecified atrial fibrillation: Secondary | ICD-10-CM | POA: Diagnosis not present

## 2019-12-08 DIAGNOSIS — M1712 Unilateral primary osteoarthritis, left knee: Secondary | ICD-10-CM | POA: Diagnosis not present

## 2019-12-08 DIAGNOSIS — M170 Bilateral primary osteoarthritis of knee: Secondary | ICD-10-CM | POA: Diagnosis not present

## 2019-12-08 DIAGNOSIS — Z20822 Contact with and (suspected) exposure to covid-19: Secondary | ICD-10-CM | POA: Diagnosis not present

## 2019-12-08 DIAGNOSIS — K219 Gastro-esophageal reflux disease without esophagitis: Secondary | ICD-10-CM | POA: Diagnosis not present

## 2019-12-08 DIAGNOSIS — M109 Gout, unspecified: Secondary | ICD-10-CM | POA: Diagnosis not present

## 2019-12-08 DIAGNOSIS — G629 Polyneuropathy, unspecified: Secondary | ICD-10-CM | POA: Diagnosis not present

## 2019-12-14 DIAGNOSIS — E118 Type 2 diabetes mellitus with unspecified complications: Secondary | ICD-10-CM | POA: Diagnosis not present

## 2019-12-14 DIAGNOSIS — K819 Cholecystitis, unspecified: Secondary | ICD-10-CM | POA: Diagnosis not present

## 2019-12-14 DIAGNOSIS — K219 Gastro-esophageal reflux disease without esophagitis: Secondary | ICD-10-CM | POA: Diagnosis not present

## 2019-12-14 DIAGNOSIS — Z6834 Body mass index (BMI) 34.0-34.9, adult: Secondary | ICD-10-CM | POA: Diagnosis not present

## 2019-12-14 DIAGNOSIS — E291 Testicular hypofunction: Secondary | ICD-10-CM | POA: Diagnosis not present

## 2019-12-14 DIAGNOSIS — I700 Atherosclerosis of aorta: Secondary | ICD-10-CM | POA: Diagnosis not present

## 2019-12-14 DIAGNOSIS — Z79899 Other long term (current) drug therapy: Secondary | ICD-10-CM | POA: Diagnosis not present

## 2019-12-15 DIAGNOSIS — K3189 Other diseases of stomach and duodenum: Secondary | ICD-10-CM | POA: Diagnosis not present

## 2019-12-15 DIAGNOSIS — I10 Essential (primary) hypertension: Secondary | ICD-10-CM | POA: Diagnosis not present

## 2019-12-15 DIAGNOSIS — Z7901 Long term (current) use of anticoagulants: Secondary | ICD-10-CM | POA: Diagnosis not present

## 2019-12-15 DIAGNOSIS — G4733 Obstructive sleep apnea (adult) (pediatric): Secondary | ICD-10-CM | POA: Diagnosis not present

## 2019-12-15 DIAGNOSIS — R748 Abnormal levels of other serum enzymes: Secondary | ICD-10-CM | POA: Diagnosis not present

## 2019-12-15 DIAGNOSIS — K2980 Duodenitis without bleeding: Secondary | ICD-10-CM | POA: Diagnosis not present

## 2019-12-15 DIAGNOSIS — K810 Acute cholecystitis: Secondary | ICD-10-CM | POA: Diagnosis not present

## 2019-12-15 DIAGNOSIS — Z7984 Long term (current) use of oral hypoglycemic drugs: Secondary | ICD-10-CM | POA: Diagnosis not present

## 2019-12-15 DIAGNOSIS — K851 Biliary acute pancreatitis without necrosis or infection: Secondary | ICD-10-CM | POA: Diagnosis not present

## 2019-12-15 DIAGNOSIS — E119 Type 2 diabetes mellitus without complications: Secondary | ICD-10-CM | POA: Diagnosis not present

## 2019-12-15 DIAGNOSIS — R1011 Right upper quadrant pain: Secondary | ICD-10-CM | POA: Diagnosis not present

## 2019-12-15 DIAGNOSIS — K8020 Calculus of gallbladder without cholecystitis without obstruction: Secondary | ICD-10-CM | POA: Diagnosis not present

## 2019-12-15 DIAGNOSIS — Z20822 Contact with and (suspected) exposure to covid-19: Secondary | ICD-10-CM | POA: Diagnosis not present

## 2019-12-15 DIAGNOSIS — Z79890 Hormone replacement therapy (postmenopausal): Secondary | ICD-10-CM | POA: Diagnosis not present

## 2019-12-15 DIAGNOSIS — K819 Cholecystitis, unspecified: Secondary | ICD-10-CM | POA: Diagnosis not present

## 2019-12-15 DIAGNOSIS — I4891 Unspecified atrial fibrillation: Secondary | ICD-10-CM | POA: Diagnosis not present

## 2019-12-15 DIAGNOSIS — K7689 Other specified diseases of liver: Secondary | ICD-10-CM | POA: Diagnosis not present

## 2019-12-16 ENCOUNTER — Ambulatory Visit: Payer: Medicare HMO | Admitting: Cardiology

## 2019-12-16 DIAGNOSIS — Z451 Encounter for adjustment and management of infusion pump: Secondary | ICD-10-CM | POA: Diagnosis not present

## 2019-12-16 DIAGNOSIS — K810 Acute cholecystitis: Secondary | ICD-10-CM | POA: Diagnosis not present

## 2019-12-16 DIAGNOSIS — K851 Biliary acute pancreatitis without necrosis or infection: Secondary | ICD-10-CM | POA: Diagnosis not present

## 2019-12-16 DIAGNOSIS — M6250 Muscle wasting and atrophy, not elsewhere classified, unspecified site: Secondary | ICD-10-CM | POA: Diagnosis not present

## 2019-12-16 DIAGNOSIS — I509 Heart failure, unspecified: Secondary | ICD-10-CM | POA: Diagnosis not present

## 2019-12-16 DIAGNOSIS — R52 Pain, unspecified: Secondary | ICD-10-CM | POA: Diagnosis not present

## 2019-12-16 DIAGNOSIS — K8020 Calculus of gallbladder without cholecystitis without obstruction: Secondary | ICD-10-CM | POA: Diagnosis not present

## 2019-12-17 DIAGNOSIS — K8020 Calculus of gallbladder without cholecystitis without obstruction: Secondary | ICD-10-CM | POA: Diagnosis not present

## 2019-12-17 DIAGNOSIS — Z9049 Acquired absence of other specified parts of digestive tract: Secondary | ICD-10-CM | POA: Diagnosis not present

## 2019-12-17 DIAGNOSIS — K810 Acute cholecystitis: Secondary | ICD-10-CM | POA: Diagnosis not present

## 2019-12-17 DIAGNOSIS — M549 Dorsalgia, unspecified: Secondary | ICD-10-CM | POA: Diagnosis not present

## 2019-12-17 DIAGNOSIS — K82A1 Gangrene of gallbladder in cholecystitis: Secondary | ICD-10-CM | POA: Diagnosis not present

## 2019-12-17 DIAGNOSIS — I4891 Unspecified atrial fibrillation: Secondary | ICD-10-CM | POA: Diagnosis not present

## 2019-12-17 DIAGNOSIS — R001 Bradycardia, unspecified: Secondary | ICD-10-CM | POA: Diagnosis not present

## 2019-12-17 DIAGNOSIS — K82A2 Perforation of gallbladder in cholecystitis: Secondary | ICD-10-CM | POA: Diagnosis not present

## 2019-12-17 DIAGNOSIS — D72829 Elevated white blood cell count, unspecified: Secondary | ICD-10-CM | POA: Diagnosis not present

## 2019-12-17 DIAGNOSIS — Z5331 Laparoscopic surgical procedure converted to open procedure: Secondary | ICD-10-CM | POA: Diagnosis not present

## 2019-12-17 DIAGNOSIS — M170 Bilateral primary osteoarthritis of knee: Secondary | ICD-10-CM | POA: Diagnosis not present

## 2019-12-17 DIAGNOSIS — R1011 Right upper quadrant pain: Secondary | ICD-10-CM | POA: Diagnosis not present

## 2019-12-17 DIAGNOSIS — R945 Abnormal results of liver function studies: Secondary | ICD-10-CM | POA: Diagnosis not present

## 2019-12-17 DIAGNOSIS — K819 Cholecystitis, unspecified: Secondary | ICD-10-CM | POA: Diagnosis not present

## 2019-12-17 DIAGNOSIS — I4819 Other persistent atrial fibrillation: Secondary | ICD-10-CM | POA: Diagnosis not present

## 2019-12-17 DIAGNOSIS — I10 Essential (primary) hypertension: Secondary | ICD-10-CM | POA: Diagnosis not present

## 2019-12-17 DIAGNOSIS — K822 Perforation of gallbladder: Secondary | ICD-10-CM | POA: Diagnosis not present

## 2019-12-17 DIAGNOSIS — E119 Type 2 diabetes mellitus without complications: Secondary | ICD-10-CM | POA: Diagnosis not present

## 2019-12-17 DIAGNOSIS — K8000 Calculus of gallbladder with acute cholecystitis without obstruction: Secondary | ICD-10-CM | POA: Diagnosis not present

## 2019-12-17 DIAGNOSIS — K429 Umbilical hernia without obstruction or gangrene: Secondary | ICD-10-CM | POA: Diagnosis not present

## 2019-12-17 DIAGNOSIS — Z7984 Long term (current) use of oral hypoglycemic drugs: Secondary | ICD-10-CM | POA: Diagnosis not present

## 2019-12-28 DIAGNOSIS — E291 Testicular hypofunction: Secondary | ICD-10-CM | POA: Diagnosis not present

## 2020-01-11 DIAGNOSIS — E291 Testicular hypofunction: Secondary | ICD-10-CM | POA: Diagnosis not present

## 2020-01-25 DIAGNOSIS — E291 Testicular hypofunction: Secondary | ICD-10-CM | POA: Diagnosis not present

## 2020-02-03 DIAGNOSIS — M109 Gout, unspecified: Secondary | ICD-10-CM | POA: Diagnosis not present

## 2020-02-03 DIAGNOSIS — E291 Testicular hypofunction: Secondary | ICD-10-CM | POA: Diagnosis not present

## 2020-02-03 DIAGNOSIS — R197 Diarrhea, unspecified: Secondary | ICD-10-CM | POA: Diagnosis not present

## 2020-02-03 DIAGNOSIS — M25512 Pain in left shoulder: Secondary | ICD-10-CM | POA: Diagnosis not present

## 2020-02-03 DIAGNOSIS — E039 Hypothyroidism, unspecified: Secondary | ICD-10-CM | POA: Diagnosis not present

## 2020-02-03 DIAGNOSIS — Z79899 Other long term (current) drug therapy: Secondary | ICD-10-CM | POA: Diagnosis not present

## 2020-02-03 DIAGNOSIS — I10 Essential (primary) hypertension: Secondary | ICD-10-CM | POA: Diagnosis not present

## 2020-02-03 DIAGNOSIS — E785 Hyperlipidemia, unspecified: Secondary | ICD-10-CM | POA: Diagnosis not present

## 2020-02-03 DIAGNOSIS — E118 Type 2 diabetes mellitus with unspecified complications: Secondary | ICD-10-CM | POA: Diagnosis not present

## 2020-02-16 DIAGNOSIS — E291 Testicular hypofunction: Secondary | ICD-10-CM | POA: Diagnosis not present

## 2020-02-23 DIAGNOSIS — M25512 Pain in left shoulder: Secondary | ICD-10-CM | POA: Diagnosis not present

## 2020-02-23 DIAGNOSIS — G8929 Other chronic pain: Secondary | ICD-10-CM | POA: Diagnosis not present

## 2020-03-01 DIAGNOSIS — E291 Testicular hypofunction: Secondary | ICD-10-CM | POA: Diagnosis not present

## 2020-03-15 DIAGNOSIS — E291 Testicular hypofunction: Secondary | ICD-10-CM | POA: Diagnosis not present

## 2020-03-29 DIAGNOSIS — E291 Testicular hypofunction: Secondary | ICD-10-CM | POA: Diagnosis not present

## 2020-04-12 DIAGNOSIS — E291 Testicular hypofunction: Secondary | ICD-10-CM | POA: Diagnosis not present

## 2020-04-26 DIAGNOSIS — E291 Testicular hypofunction: Secondary | ICD-10-CM | POA: Diagnosis not present

## 2020-05-11 DIAGNOSIS — E785 Hyperlipidemia, unspecified: Secondary | ICD-10-CM | POA: Diagnosis not present

## 2020-05-11 DIAGNOSIS — Z125 Encounter for screening for malignant neoplasm of prostate: Secondary | ICD-10-CM | POA: Diagnosis not present

## 2020-05-11 DIAGNOSIS — E039 Hypothyroidism, unspecified: Secondary | ICD-10-CM | POA: Diagnosis not present

## 2020-05-11 DIAGNOSIS — E291 Testicular hypofunction: Secondary | ICD-10-CM | POA: Diagnosis not present

## 2020-05-11 DIAGNOSIS — Z79899 Other long term (current) drug therapy: Secondary | ICD-10-CM | POA: Diagnosis not present

## 2020-05-11 DIAGNOSIS — E118 Type 2 diabetes mellitus with unspecified complications: Secondary | ICD-10-CM | POA: Diagnosis not present

## 2020-05-11 DIAGNOSIS — M109 Gout, unspecified: Secondary | ICD-10-CM | POA: Diagnosis not present

## 2020-05-11 DIAGNOSIS — I10 Essential (primary) hypertension: Secondary | ICD-10-CM | POA: Diagnosis not present

## 2020-05-17 DIAGNOSIS — Z1152 Encounter for screening for COVID-19: Secondary | ICD-10-CM | POA: Diagnosis not present

## 2020-05-17 DIAGNOSIS — A09 Infectious gastroenteritis and colitis, unspecified: Secondary | ICD-10-CM | POA: Diagnosis not present

## 2020-05-23 DIAGNOSIS — E291 Testicular hypofunction: Secondary | ICD-10-CM | POA: Diagnosis not present

## 2020-06-06 DIAGNOSIS — L820 Inflamed seborrheic keratosis: Secondary | ICD-10-CM | POA: Diagnosis not present

## 2020-06-06 DIAGNOSIS — L728 Other follicular cysts of the skin and subcutaneous tissue: Secondary | ICD-10-CM | POA: Diagnosis not present

## 2020-06-06 DIAGNOSIS — L821 Other seborrheic keratosis: Secondary | ICD-10-CM | POA: Diagnosis not present

## 2020-06-13 DIAGNOSIS — E291 Testicular hypofunction: Secondary | ICD-10-CM | POA: Diagnosis not present

## 2020-07-04 DIAGNOSIS — E291 Testicular hypofunction: Secondary | ICD-10-CM | POA: Diagnosis not present

## 2020-07-07 DIAGNOSIS — Z1152 Encounter for screening for COVID-19: Secondary | ICD-10-CM | POA: Diagnosis not present

## 2020-07-07 DIAGNOSIS — J069 Acute upper respiratory infection, unspecified: Secondary | ICD-10-CM | POA: Diagnosis not present

## 2020-07-09 DIAGNOSIS — J069 Acute upper respiratory infection, unspecified: Secondary | ICD-10-CM | POA: Diagnosis not present

## 2020-07-16 DIAGNOSIS — G4730 Sleep apnea, unspecified: Secondary | ICD-10-CM | POA: Diagnosis not present

## 2020-07-16 DIAGNOSIS — E291 Testicular hypofunction: Secondary | ICD-10-CM | POA: Diagnosis not present

## 2020-08-01 DIAGNOSIS — Z139 Encounter for screening, unspecified: Secondary | ICD-10-CM | POA: Diagnosis not present

## 2020-08-01 DIAGNOSIS — Z0000 Encounter for general adult medical examination without abnormal findings: Secondary | ICD-10-CM | POA: Diagnosis not present

## 2020-08-01 DIAGNOSIS — Z9181 History of falling: Secondary | ICD-10-CM | POA: Diagnosis not present

## 2020-08-01 DIAGNOSIS — Z1331 Encounter for screening for depression: Secondary | ICD-10-CM | POA: Diagnosis not present

## 2020-08-01 DIAGNOSIS — E785 Hyperlipidemia, unspecified: Secondary | ICD-10-CM | POA: Diagnosis not present

## 2020-08-01 DIAGNOSIS — E669 Obesity, unspecified: Secondary | ICD-10-CM | POA: Diagnosis not present

## 2020-08-06 DIAGNOSIS — E291 Testicular hypofunction: Secondary | ICD-10-CM | POA: Diagnosis not present

## 2020-08-23 DIAGNOSIS — G4730 Sleep apnea, unspecified: Secondary | ICD-10-CM | POA: Diagnosis not present

## 2020-08-27 DIAGNOSIS — E119 Type 2 diabetes mellitus without complications: Secondary | ICD-10-CM | POA: Diagnosis not present

## 2020-08-27 DIAGNOSIS — E291 Testicular hypofunction: Secondary | ICD-10-CM | POA: Diagnosis not present

## 2020-08-27 DIAGNOSIS — H25813 Combined forms of age-related cataract, bilateral: Secondary | ICD-10-CM | POA: Diagnosis not present

## 2020-09-07 DIAGNOSIS — D6869 Other thrombophilia: Secondary | ICD-10-CM | POA: Diagnosis not present

## 2020-09-07 DIAGNOSIS — E039 Hypothyroidism, unspecified: Secondary | ICD-10-CM | POA: Diagnosis not present

## 2020-09-07 DIAGNOSIS — E291 Testicular hypofunction: Secondary | ICD-10-CM | POA: Diagnosis not present

## 2020-09-07 DIAGNOSIS — M109 Gout, unspecified: Secondary | ICD-10-CM | POA: Diagnosis not present

## 2020-09-07 DIAGNOSIS — E1140 Type 2 diabetes mellitus with diabetic neuropathy, unspecified: Secondary | ICD-10-CM | POA: Diagnosis not present

## 2020-09-07 DIAGNOSIS — Z79899 Other long term (current) drug therapy: Secondary | ICD-10-CM | POA: Diagnosis not present

## 2020-09-07 DIAGNOSIS — E118 Type 2 diabetes mellitus with unspecified complications: Secondary | ICD-10-CM | POA: Diagnosis not present

## 2020-09-07 DIAGNOSIS — I700 Atherosclerosis of aorta: Secondary | ICD-10-CM | POA: Diagnosis not present

## 2020-09-07 DIAGNOSIS — E785 Hyperlipidemia, unspecified: Secondary | ICD-10-CM | POA: Diagnosis not present

## 2020-09-07 DIAGNOSIS — I4891 Unspecified atrial fibrillation: Secondary | ICD-10-CM | POA: Diagnosis not present

## 2020-09-17 DIAGNOSIS — E291 Testicular hypofunction: Secondary | ICD-10-CM | POA: Diagnosis not present

## 2020-09-26 DIAGNOSIS — J069 Acute upper respiratory infection, unspecified: Secondary | ICD-10-CM | POA: Diagnosis not present

## 2020-10-01 DIAGNOSIS — J209 Acute bronchitis, unspecified: Secondary | ICD-10-CM | POA: Diagnosis not present

## 2020-10-01 DIAGNOSIS — H109 Unspecified conjunctivitis: Secondary | ICD-10-CM | POA: Diagnosis not present

## 2020-10-08 DIAGNOSIS — E291 Testicular hypofunction: Secondary | ICD-10-CM | POA: Diagnosis not present

## 2020-10-24 DIAGNOSIS — Z1211 Encounter for screening for malignant neoplasm of colon: Secondary | ICD-10-CM | POA: Diagnosis not present

## 2020-11-01 DIAGNOSIS — E291 Testicular hypofunction: Secondary | ICD-10-CM | POA: Diagnosis not present

## 2020-11-02 DIAGNOSIS — S61012A Laceration without foreign body of left thumb without damage to nail, initial encounter: Secondary | ICD-10-CM | POA: Diagnosis not present

## 2020-11-12 DIAGNOSIS — Z6836 Body mass index (BMI) 36.0-36.9, adult: Secondary | ICD-10-CM | POA: Diagnosis not present

## 2020-11-12 DIAGNOSIS — M179 Osteoarthritis of knee, unspecified: Secondary | ICD-10-CM | POA: Diagnosis not present

## 2020-11-12 DIAGNOSIS — M1712 Unilateral primary osteoarthritis, left knee: Secondary | ICD-10-CM | POA: Diagnosis not present

## 2020-11-12 DIAGNOSIS — N400 Enlarged prostate without lower urinary tract symptoms: Secondary | ICD-10-CM | POA: Diagnosis not present

## 2020-11-12 DIAGNOSIS — M25562 Pain in left knee: Secondary | ICD-10-CM | POA: Diagnosis not present

## 2020-11-22 DIAGNOSIS — G4733 Obstructive sleep apnea (adult) (pediatric): Secondary | ICD-10-CM | POA: Diagnosis not present

## 2020-11-23 DIAGNOSIS — E291 Testicular hypofunction: Secondary | ICD-10-CM | POA: Diagnosis not present

## 2020-12-12 DIAGNOSIS — I4891 Unspecified atrial fibrillation: Secondary | ICD-10-CM | POA: Diagnosis not present

## 2020-12-12 DIAGNOSIS — I10 Essential (primary) hypertension: Secondary | ICD-10-CM | POA: Diagnosis not present

## 2020-12-12 DIAGNOSIS — Z79899 Other long term (current) drug therapy: Secondary | ICD-10-CM | POA: Diagnosis not present

## 2020-12-12 DIAGNOSIS — E118 Type 2 diabetes mellitus with unspecified complications: Secondary | ICD-10-CM | POA: Diagnosis not present

## 2020-12-12 DIAGNOSIS — E1169 Type 2 diabetes mellitus with other specified complication: Secondary | ICD-10-CM | POA: Diagnosis not present

## 2020-12-12 DIAGNOSIS — E785 Hyperlipidemia, unspecified: Secondary | ICD-10-CM | POA: Diagnosis not present

## 2020-12-12 DIAGNOSIS — M109 Gout, unspecified: Secondary | ICD-10-CM | POA: Diagnosis not present

## 2020-12-12 DIAGNOSIS — E291 Testicular hypofunction: Secondary | ICD-10-CM | POA: Diagnosis not present

## 2020-12-12 DIAGNOSIS — E1140 Type 2 diabetes mellitus with diabetic neuropathy, unspecified: Secondary | ICD-10-CM | POA: Diagnosis not present

## 2020-12-12 DIAGNOSIS — E039 Hypothyroidism, unspecified: Secondary | ICD-10-CM | POA: Diagnosis not present

## 2020-12-23 DIAGNOSIS — G4733 Obstructive sleep apnea (adult) (pediatric): Secondary | ICD-10-CM | POA: Diagnosis not present

## 2021-01-02 DIAGNOSIS — E291 Testicular hypofunction: Secondary | ICD-10-CM | POA: Diagnosis not present

## 2021-01-22 DIAGNOSIS — G4733 Obstructive sleep apnea (adult) (pediatric): Secondary | ICD-10-CM | POA: Diagnosis not present

## 2021-01-23 DIAGNOSIS — E291 Testicular hypofunction: Secondary | ICD-10-CM | POA: Diagnosis not present

## 2021-01-29 DIAGNOSIS — J069 Acute upper respiratory infection, unspecified: Secondary | ICD-10-CM | POA: Diagnosis not present

## 2021-01-29 DIAGNOSIS — Z1152 Encounter for screening for COVID-19: Secondary | ICD-10-CM | POA: Diagnosis not present

## 2021-01-29 DIAGNOSIS — R051 Acute cough: Secondary | ICD-10-CM | POA: Diagnosis not present

## 2021-02-13 DIAGNOSIS — E291 Testicular hypofunction: Secondary | ICD-10-CM | POA: Diagnosis not present

## 2021-02-16 ENCOUNTER — Other Ambulatory Visit: Payer: Self-pay | Admitting: Family

## 2021-02-26 DIAGNOSIS — E291 Testicular hypofunction: Secondary | ICD-10-CM | POA: Diagnosis not present

## 2021-02-26 DIAGNOSIS — M109 Gout, unspecified: Secondary | ICD-10-CM | POA: Diagnosis not present

## 2021-02-26 DIAGNOSIS — D6869 Other thrombophilia: Secondary | ICD-10-CM | POA: Diagnosis not present

## 2021-02-26 DIAGNOSIS — F0390 Unspecified dementia without behavioral disturbance: Secondary | ICD-10-CM | POA: Diagnosis not present

## 2021-02-26 DIAGNOSIS — E1169 Type 2 diabetes mellitus with other specified complication: Secondary | ICD-10-CM | POA: Diagnosis not present

## 2021-02-26 DIAGNOSIS — E1140 Type 2 diabetes mellitus with diabetic neuropathy, unspecified: Secondary | ICD-10-CM | POA: Diagnosis not present

## 2021-02-26 DIAGNOSIS — E785 Hyperlipidemia, unspecified: Secondary | ICD-10-CM | POA: Diagnosis not present

## 2021-02-26 DIAGNOSIS — Z79899 Other long term (current) drug therapy: Secondary | ICD-10-CM | POA: Diagnosis not present

## 2021-02-26 DIAGNOSIS — E118 Type 2 diabetes mellitus with unspecified complications: Secondary | ICD-10-CM | POA: Diagnosis not present

## 2021-02-26 DIAGNOSIS — E039 Hypothyroidism, unspecified: Secondary | ICD-10-CM | POA: Diagnosis not present

## 2021-02-28 ENCOUNTER — Other Ambulatory Visit: Payer: Self-pay | Admitting: Family

## 2021-03-06 DIAGNOSIS — E291 Testicular hypofunction: Secondary | ICD-10-CM | POA: Diagnosis not present

## 2021-03-27 DIAGNOSIS — U071 COVID-19: Secondary | ICD-10-CM | POA: Diagnosis not present

## 2021-03-27 DIAGNOSIS — E291 Testicular hypofunction: Secondary | ICD-10-CM | POA: Diagnosis not present

## 2021-04-04 DIAGNOSIS — H6123 Impacted cerumen, bilateral: Secondary | ICD-10-CM | POA: Diagnosis not present

## 2021-04-04 DIAGNOSIS — Z6836 Body mass index (BMI) 36.0-36.9, adult: Secondary | ICD-10-CM | POA: Diagnosis not present

## 2021-04-04 DIAGNOSIS — M25512 Pain in left shoulder: Secondary | ICD-10-CM | POA: Diagnosis not present

## 2021-04-17 DIAGNOSIS — E291 Testicular hypofunction: Secondary | ICD-10-CM | POA: Diagnosis not present

## 2021-05-02 DIAGNOSIS — R69 Illness, unspecified: Secondary | ICD-10-CM | POA: Diagnosis not present

## 2021-05-08 DIAGNOSIS — E291 Testicular hypofunction: Secondary | ICD-10-CM | POA: Diagnosis not present

## 2021-05-09 DIAGNOSIS — R69 Illness, unspecified: Secondary | ICD-10-CM | POA: Diagnosis not present

## 2021-05-19 DIAGNOSIS — Z7901 Long term (current) use of anticoagulants: Secondary | ICD-10-CM | POA: Diagnosis not present

## 2021-05-19 DIAGNOSIS — K91840 Postprocedural hemorrhage and hematoma of a digestive system organ or structure following a digestive system procedure: Secondary | ICD-10-CM | POA: Diagnosis not present

## 2021-05-19 DIAGNOSIS — K0889 Other specified disorders of teeth and supporting structures: Secondary | ICD-10-CM | POA: Diagnosis not present

## 2021-05-19 DIAGNOSIS — I10 Essential (primary) hypertension: Secondary | ICD-10-CM | POA: Diagnosis not present

## 2021-05-19 DIAGNOSIS — Z886 Allergy status to analgesic agent status: Secondary | ICD-10-CM | POA: Diagnosis not present

## 2021-05-19 DIAGNOSIS — Z98818 Other dental procedure status: Secondary | ICD-10-CM | POA: Diagnosis not present

## 2021-05-19 DIAGNOSIS — L7622 Postprocedural hemorrhage and hematoma of skin and subcutaneous tissue following other procedure: Secondary | ICD-10-CM | POA: Diagnosis not present

## 2021-05-19 DIAGNOSIS — Z7984 Long term (current) use of oral hypoglycemic drugs: Secondary | ICD-10-CM | POA: Diagnosis not present

## 2021-05-29 DIAGNOSIS — I4891 Unspecified atrial fibrillation: Secondary | ICD-10-CM | POA: Diagnosis not present

## 2021-05-29 DIAGNOSIS — E1169 Type 2 diabetes mellitus with other specified complication: Secondary | ICD-10-CM | POA: Diagnosis not present

## 2021-05-29 DIAGNOSIS — E1165 Type 2 diabetes mellitus with hyperglycemia: Secondary | ICD-10-CM | POA: Diagnosis not present

## 2021-05-29 DIAGNOSIS — M109 Gout, unspecified: Secondary | ICD-10-CM | POA: Diagnosis not present

## 2021-05-29 DIAGNOSIS — E039 Hypothyroidism, unspecified: Secondary | ICD-10-CM | POA: Diagnosis not present

## 2021-05-29 DIAGNOSIS — E291 Testicular hypofunction: Secondary | ICD-10-CM | POA: Diagnosis not present

## 2021-05-29 DIAGNOSIS — E785 Hyperlipidemia, unspecified: Secondary | ICD-10-CM | POA: Diagnosis not present

## 2021-05-29 DIAGNOSIS — I700 Atherosclerosis of aorta: Secondary | ICD-10-CM | POA: Diagnosis not present

## 2021-05-29 DIAGNOSIS — Z125 Encounter for screening for malignant neoplasm of prostate: Secondary | ICD-10-CM | POA: Diagnosis not present

## 2021-05-29 DIAGNOSIS — Z79899 Other long term (current) drug therapy: Secondary | ICD-10-CM | POA: Diagnosis not present

## 2021-06-19 DIAGNOSIS — E291 Testicular hypofunction: Secondary | ICD-10-CM | POA: Diagnosis not present

## 2021-07-10 DIAGNOSIS — E291 Testicular hypofunction: Secondary | ICD-10-CM | POA: Diagnosis not present

## 2021-07-26 DIAGNOSIS — Z6835 Body mass index (BMI) 35.0-35.9, adult: Secondary | ICD-10-CM | POA: Diagnosis not present

## 2021-07-26 DIAGNOSIS — K649 Unspecified hemorrhoids: Secondary | ICD-10-CM | POA: Diagnosis not present

## 2021-07-31 DIAGNOSIS — E291 Testicular hypofunction: Secondary | ICD-10-CM | POA: Diagnosis not present

## 2021-08-06 DIAGNOSIS — Z6835 Body mass index (BMI) 35.0-35.9, adult: Secondary | ICD-10-CM | POA: Diagnosis not present

## 2021-08-06 DIAGNOSIS — Z1331 Encounter for screening for depression: Secondary | ICD-10-CM | POA: Diagnosis not present

## 2021-08-06 DIAGNOSIS — Z0000 Encounter for general adult medical examination without abnormal findings: Secondary | ICD-10-CM | POA: Diagnosis not present

## 2021-08-06 DIAGNOSIS — E669 Obesity, unspecified: Secondary | ICD-10-CM | POA: Diagnosis not present

## 2021-08-06 DIAGNOSIS — Z9181 History of falling: Secondary | ICD-10-CM | POA: Diagnosis not present

## 2021-08-06 DIAGNOSIS — Z139 Encounter for screening, unspecified: Secondary | ICD-10-CM | POA: Diagnosis not present

## 2021-08-06 DIAGNOSIS — E785 Hyperlipidemia, unspecified: Secondary | ICD-10-CM | POA: Diagnosis not present

## 2021-08-21 DIAGNOSIS — E291 Testicular hypofunction: Secondary | ICD-10-CM | POA: Diagnosis not present

## 2021-08-28 DIAGNOSIS — E1140 Type 2 diabetes mellitus with diabetic neuropathy, unspecified: Secondary | ICD-10-CM | POA: Diagnosis not present

## 2021-08-28 DIAGNOSIS — E039 Hypothyroidism, unspecified: Secondary | ICD-10-CM | POA: Diagnosis not present

## 2021-08-28 DIAGNOSIS — E1169 Type 2 diabetes mellitus with other specified complication: Secondary | ICD-10-CM | POA: Diagnosis not present

## 2021-08-28 DIAGNOSIS — K219 Gastro-esophageal reflux disease without esophagitis: Secondary | ICD-10-CM | POA: Diagnosis not present

## 2021-08-28 DIAGNOSIS — E785 Hyperlipidemia, unspecified: Secondary | ICD-10-CM | POA: Diagnosis not present

## 2021-08-28 DIAGNOSIS — Z79899 Other long term (current) drug therapy: Secondary | ICD-10-CM | POA: Diagnosis not present

## 2021-08-28 DIAGNOSIS — I10 Essential (primary) hypertension: Secondary | ICD-10-CM | POA: Diagnosis not present

## 2021-08-28 DIAGNOSIS — D6869 Other thrombophilia: Secondary | ICD-10-CM | POA: Diagnosis not present

## 2021-08-28 DIAGNOSIS — M109 Gout, unspecified: Secondary | ICD-10-CM | POA: Diagnosis not present

## 2021-08-28 DIAGNOSIS — E1165 Type 2 diabetes mellitus with hyperglycemia: Secondary | ICD-10-CM | POA: Diagnosis not present

## 2021-09-02 DIAGNOSIS — E119 Type 2 diabetes mellitus without complications: Secondary | ICD-10-CM | POA: Diagnosis not present

## 2021-09-02 DIAGNOSIS — H2513 Age-related nuclear cataract, bilateral: Secondary | ICD-10-CM | POA: Diagnosis not present

## 2021-09-12 DIAGNOSIS — E291 Testicular hypofunction: Secondary | ICD-10-CM | POA: Diagnosis not present

## 2021-10-04 DIAGNOSIS — K219 Gastro-esophageal reflux disease without esophagitis: Secondary | ICD-10-CM | POA: Diagnosis not present

## 2021-10-04 DIAGNOSIS — Z6835 Body mass index (BMI) 35.0-35.9, adult: Secondary | ICD-10-CM | POA: Diagnosis not present

## 2021-10-04 DIAGNOSIS — E291 Testicular hypofunction: Secondary | ICD-10-CM | POA: Diagnosis not present

## 2021-10-09 DIAGNOSIS — N400 Enlarged prostate without lower urinary tract symptoms: Secondary | ICD-10-CM | POA: Diagnosis not present

## 2021-10-09 DIAGNOSIS — R350 Frequency of micturition: Secondary | ICD-10-CM | POA: Diagnosis not present

## 2021-10-09 DIAGNOSIS — Z6835 Body mass index (BMI) 35.0-35.9, adult: Secondary | ICD-10-CM | POA: Diagnosis not present

## 2021-10-25 DIAGNOSIS — E291 Testicular hypofunction: Secondary | ICD-10-CM | POA: Diagnosis not present

## 2021-11-06 DIAGNOSIS — N401 Enlarged prostate with lower urinary tract symptoms: Secondary | ICD-10-CM | POA: Diagnosis not present

## 2021-11-06 DIAGNOSIS — R81 Glycosuria: Secondary | ICD-10-CM | POA: Diagnosis not present

## 2021-11-06 DIAGNOSIS — Z79899 Other long term (current) drug therapy: Secondary | ICD-10-CM | POA: Diagnosis not present

## 2021-11-15 DIAGNOSIS — E291 Testicular hypofunction: Secondary | ICD-10-CM | POA: Diagnosis not present

## 2021-12-06 DIAGNOSIS — E1169 Type 2 diabetes mellitus with other specified complication: Secondary | ICD-10-CM | POA: Diagnosis not present

## 2021-12-06 DIAGNOSIS — E291 Testicular hypofunction: Secondary | ICD-10-CM | POA: Diagnosis not present

## 2021-12-06 DIAGNOSIS — E1165 Type 2 diabetes mellitus with hyperglycemia: Secondary | ICD-10-CM | POA: Diagnosis not present

## 2021-12-06 DIAGNOSIS — Z6835 Body mass index (BMI) 35.0-35.9, adult: Secondary | ICD-10-CM | POA: Diagnosis not present

## 2021-12-06 DIAGNOSIS — Z79899 Other long term (current) drug therapy: Secondary | ICD-10-CM | POA: Diagnosis not present

## 2021-12-06 DIAGNOSIS — E039 Hypothyroidism, unspecified: Secondary | ICD-10-CM | POA: Diagnosis not present

## 2021-12-06 DIAGNOSIS — M109 Gout, unspecified: Secondary | ICD-10-CM | POA: Diagnosis not present

## 2021-12-24 DIAGNOSIS — R509 Fever, unspecified: Secondary | ICD-10-CM | POA: Diagnosis not present

## 2021-12-24 DIAGNOSIS — R519 Headache, unspecified: Secondary | ICD-10-CM | POA: Diagnosis not present

## 2021-12-24 DIAGNOSIS — Z886 Allergy status to analgesic agent status: Secondary | ICD-10-CM | POA: Diagnosis not present

## 2021-12-24 DIAGNOSIS — Z7901 Long term (current) use of anticoagulants: Secondary | ICD-10-CM | POA: Diagnosis not present

## 2021-12-24 DIAGNOSIS — U071 COVID-19: Secondary | ICD-10-CM | POA: Diagnosis not present

## 2021-12-24 DIAGNOSIS — G4733 Obstructive sleep apnea (adult) (pediatric): Secondary | ICD-10-CM | POA: Diagnosis not present

## 2021-12-24 DIAGNOSIS — Z6835 Body mass index (BMI) 35.0-35.9, adult: Secondary | ICD-10-CM | POA: Diagnosis not present

## 2021-12-24 DIAGNOSIS — A419 Sepsis, unspecified organism: Secondary | ICD-10-CM | POA: Diagnosis not present

## 2021-12-24 DIAGNOSIS — R531 Weakness: Secondary | ICD-10-CM | POA: Diagnosis not present

## 2021-12-24 DIAGNOSIS — I4819 Other persistent atrial fibrillation: Secondary | ICD-10-CM | POA: Diagnosis not present

## 2021-12-24 DIAGNOSIS — Z7984 Long term (current) use of oral hypoglycemic drugs: Secondary | ICD-10-CM | POA: Diagnosis not present

## 2021-12-24 DIAGNOSIS — R000 Tachycardia, unspecified: Secondary | ICD-10-CM | POA: Diagnosis not present

## 2021-12-24 DIAGNOSIS — R059 Cough, unspecified: Secondary | ICD-10-CM | POA: Diagnosis not present

## 2021-12-24 DIAGNOSIS — I10 Essential (primary) hypertension: Secondary | ICD-10-CM | POA: Diagnosis not present

## 2021-12-24 DIAGNOSIS — I4892 Unspecified atrial flutter: Secondary | ICD-10-CM | POA: Diagnosis not present

## 2021-12-24 DIAGNOSIS — E119 Type 2 diabetes mellitus without complications: Secondary | ICD-10-CM | POA: Diagnosis not present

## 2021-12-26 DIAGNOSIS — U071 COVID-19: Secondary | ICD-10-CM | POA: Diagnosis not present

## 2022-01-01 DIAGNOSIS — E291 Testicular hypofunction: Secondary | ICD-10-CM | POA: Diagnosis not present

## 2022-01-07 DIAGNOSIS — R81 Glycosuria: Secondary | ICD-10-CM | POA: Diagnosis not present

## 2022-01-07 DIAGNOSIS — N5314 Retrograde ejaculation: Secondary | ICD-10-CM | POA: Diagnosis not present

## 2022-01-07 DIAGNOSIS — N401 Enlarged prostate with lower urinary tract symptoms: Secondary | ICD-10-CM | POA: Diagnosis not present

## 2022-01-22 DIAGNOSIS — E291 Testicular hypofunction: Secondary | ICD-10-CM | POA: Diagnosis not present

## 2022-02-12 DIAGNOSIS — E291 Testicular hypofunction: Secondary | ICD-10-CM | POA: Diagnosis not present

## 2022-03-05 DIAGNOSIS — E291 Testicular hypofunction: Secondary | ICD-10-CM | POA: Diagnosis not present

## 2022-03-10 DIAGNOSIS — I10 Essential (primary) hypertension: Secondary | ICD-10-CM | POA: Diagnosis not present

## 2022-03-10 DIAGNOSIS — E1169 Type 2 diabetes mellitus with other specified complication: Secondary | ICD-10-CM | POA: Diagnosis not present

## 2022-03-10 DIAGNOSIS — I4891 Unspecified atrial fibrillation: Secondary | ICD-10-CM | POA: Diagnosis not present

## 2022-03-10 DIAGNOSIS — F0390 Unspecified dementia without behavioral disturbance: Secondary | ICD-10-CM | POA: Diagnosis not present

## 2022-03-10 DIAGNOSIS — E039 Hypothyroidism, unspecified: Secondary | ICD-10-CM | POA: Diagnosis not present

## 2022-03-10 DIAGNOSIS — M109 Gout, unspecified: Secondary | ICD-10-CM | POA: Diagnosis not present

## 2022-03-10 DIAGNOSIS — Z79899 Other long term (current) drug therapy: Secondary | ICD-10-CM | POA: Diagnosis not present

## 2022-03-10 DIAGNOSIS — E785 Hyperlipidemia, unspecified: Secondary | ICD-10-CM | POA: Diagnosis not present

## 2022-03-10 DIAGNOSIS — E1165 Type 2 diabetes mellitus with hyperglycemia: Secondary | ICD-10-CM | POA: Diagnosis not present

## 2022-03-10 DIAGNOSIS — E291 Testicular hypofunction: Secondary | ICD-10-CM | POA: Diagnosis not present

## 2022-03-18 DIAGNOSIS — H903 Sensorineural hearing loss, bilateral: Secondary | ICD-10-CM | POA: Diagnosis not present

## 2022-03-18 DIAGNOSIS — H6123 Impacted cerumen, bilateral: Secondary | ICD-10-CM | POA: Diagnosis not present

## 2022-03-26 DIAGNOSIS — R21 Rash and other nonspecific skin eruption: Secondary | ICD-10-CM | POA: Diagnosis not present

## 2022-03-26 DIAGNOSIS — E291 Testicular hypofunction: Secondary | ICD-10-CM | POA: Diagnosis not present

## 2022-03-26 DIAGNOSIS — Z6835 Body mass index (BMI) 35.0-35.9, adult: Secondary | ICD-10-CM | POA: Diagnosis not present

## 2022-04-02 DIAGNOSIS — N401 Enlarged prostate with lower urinary tract symptoms: Secondary | ICD-10-CM | POA: Diagnosis not present

## 2022-04-02 DIAGNOSIS — N5314 Retrograde ejaculation: Secondary | ICD-10-CM | POA: Diagnosis not present

## 2022-04-02 DIAGNOSIS — R81 Glycosuria: Secondary | ICD-10-CM | POA: Diagnosis not present

## 2022-04-17 DIAGNOSIS — E291 Testicular hypofunction: Secondary | ICD-10-CM | POA: Diagnosis not present

## 2022-05-08 DIAGNOSIS — Z6835 Body mass index (BMI) 35.0-35.9, adult: Secondary | ICD-10-CM | POA: Diagnosis not present

## 2022-05-08 DIAGNOSIS — L290 Pruritus ani: Secondary | ICD-10-CM | POA: Diagnosis not present

## 2022-05-08 DIAGNOSIS — E291 Testicular hypofunction: Secondary | ICD-10-CM | POA: Diagnosis not present

## 2022-05-21 ENCOUNTER — Telehealth: Payer: Self-pay

## 2022-05-21 NOTE — Patient Outreach (Signed)
  Care Coordination   05/21/2022 Name: Jesus Mccullough MRN: [id number] DOB: 1950/06/14   Care Coordination Outreach Attempts:  An unsuccessful telephone outreach was attempted today to offer the patient information about available care coordination services as a benefit of their health plan.   Follow Up Plan:  Additional outreach attempts will be made to offer the patient care coordination information and services.   Encounter Outcome:  No Answer   Care Coordination Interventions:  No, not indicated    Tomasa Rand, RN, BSN, [facility] [employer] [phone]

## 2022-05-26 ENCOUNTER — Telehealth: Payer: Self-pay

## 2022-05-26 NOTE — Patient Outreach (Signed)
  Care Coordination   05/26/2022 Name: Jesus Mccullough MRN: [id number] DOB: 10-05-1950   Care Coordination Outreach Attempts:  A second unsuccessful outreach was attempted today to offer the patient with information about available care coordination services as a benefit of their health plan.     Follow Up Plan:  No further outreach attempts will be made at this time. We have been unable to contact the patient to offer or enroll patient in care coordination services  A person answered the phone and states I have the wrong number. No additional numbers available  Encounter Outcome:  Pt. Refused   Care Coordination Interventions:  No, not indicated    Tomasa Rand, RN, BSN, [facility] [employer] [phone]

## 2022-05-29 DIAGNOSIS — E291 Testicular hypofunction: Secondary | ICD-10-CM | POA: Diagnosis not present

## 2022-06-23 DIAGNOSIS — E291 Testicular hypofunction: Secondary | ICD-10-CM | POA: Diagnosis not present

## 2022-06-25 DIAGNOSIS — Z125 Encounter for screening for malignant neoplasm of prostate: Secondary | ICD-10-CM | POA: Diagnosis not present

## 2022-06-25 DIAGNOSIS — E1165 Type 2 diabetes mellitus with hyperglycemia: Secondary | ICD-10-CM | POA: Diagnosis not present

## 2022-06-25 DIAGNOSIS — M109 Gout, unspecified: Secondary | ICD-10-CM | POA: Diagnosis not present

## 2022-06-25 DIAGNOSIS — E1169 Type 2 diabetes mellitus with other specified complication: Secondary | ICD-10-CM | POA: Diagnosis not present

## 2022-06-25 DIAGNOSIS — E291 Testicular hypofunction: Secondary | ICD-10-CM | POA: Diagnosis not present

## 2022-06-25 DIAGNOSIS — E1140 Type 2 diabetes mellitus with diabetic neuropathy, unspecified: Secondary | ICD-10-CM | POA: Diagnosis not present

## 2022-06-25 DIAGNOSIS — E039 Hypothyroidism, unspecified: Secondary | ICD-10-CM | POA: Diagnosis not present

## 2022-06-25 DIAGNOSIS — Z79899 Other long term (current) drug therapy: Secondary | ICD-10-CM | POA: Diagnosis not present

## 2022-06-25 DIAGNOSIS — Z6835 Body mass index (BMI) 35.0-35.9, adult: Secondary | ICD-10-CM | POA: Diagnosis not present

## 2022-07-09 DIAGNOSIS — Z125 Encounter for screening for malignant neoplasm of prostate: Secondary | ICD-10-CM | POA: Diagnosis not present

## 2022-07-09 DIAGNOSIS — R81 Glycosuria: Secondary | ICD-10-CM | POA: Diagnosis not present

## 2022-07-09 DIAGNOSIS — N5314 Retrograde ejaculation: Secondary | ICD-10-CM | POA: Diagnosis not present

## 2022-07-09 DIAGNOSIS — N401 Enlarged prostate with lower urinary tract symptoms: Secondary | ICD-10-CM | POA: Diagnosis not present

## 2022-07-14 DIAGNOSIS — E291 Testicular hypofunction: Secondary | ICD-10-CM | POA: Diagnosis not present

## 2022-07-28 DIAGNOSIS — E291 Testicular hypofunction: Secondary | ICD-10-CM | POA: Diagnosis not present

## 2022-08-11 DIAGNOSIS — E291 Testicular hypofunction: Secondary | ICD-10-CM | POA: Diagnosis not present

## 2022-08-25 DIAGNOSIS — E291 Testicular hypofunction: Secondary | ICD-10-CM | POA: Diagnosis not present

## 2022-09-01 DIAGNOSIS — M79672 Pain in left foot: Secondary | ICD-10-CM | POA: Diagnosis not present

## 2022-09-01 DIAGNOSIS — Z6834 Body mass index (BMI) 34.0-34.9, adult: Secondary | ICD-10-CM | POA: Diagnosis not present

## 2022-09-01 DIAGNOSIS — M109 Gout, unspecified: Secondary | ICD-10-CM | POA: Diagnosis not present

## 2022-09-04 DIAGNOSIS — N401 Enlarged prostate with lower urinary tract symptoms: Secondary | ICD-10-CM | POA: Diagnosis not present

## 2022-09-04 DIAGNOSIS — R81 Glycosuria: Secondary | ICD-10-CM | POA: Diagnosis not present

## 2022-09-04 DIAGNOSIS — N5314 Retrograde ejaculation: Secondary | ICD-10-CM | POA: Diagnosis not present

## 2022-09-05 DIAGNOSIS — M7752 Other enthesopathy of left foot: Secondary | ICD-10-CM | POA: Diagnosis not present

## 2022-09-09 DIAGNOSIS — E291 Testicular hypofunction: Secondary | ICD-10-CM | POA: Diagnosis not present

## 2022-09-24 DIAGNOSIS — E291 Testicular hypofunction: Secondary | ICD-10-CM | POA: Diagnosis not present

## 2022-10-08 DIAGNOSIS — E291 Testicular hypofunction: Secondary | ICD-10-CM | POA: Diagnosis not present

## 2022-10-13 DIAGNOSIS — E1165 Type 2 diabetes mellitus with hyperglycemia: Secondary | ICD-10-CM | POA: Diagnosis not present

## 2022-10-13 DIAGNOSIS — Z6834 Body mass index (BMI) 34.0-34.9, adult: Secondary | ICD-10-CM | POA: Diagnosis not present

## 2022-10-13 DIAGNOSIS — E785 Hyperlipidemia, unspecified: Secondary | ICD-10-CM | POA: Diagnosis not present

## 2022-10-13 DIAGNOSIS — M109 Gout, unspecified: Secondary | ICD-10-CM | POA: Diagnosis not present

## 2022-10-13 DIAGNOSIS — E291 Testicular hypofunction: Secondary | ICD-10-CM | POA: Diagnosis not present

## 2022-10-13 DIAGNOSIS — Z79899 Other long term (current) drug therapy: Secondary | ICD-10-CM | POA: Diagnosis not present

## 2022-10-13 DIAGNOSIS — E1169 Type 2 diabetes mellitus with other specified complication: Secondary | ICD-10-CM | POA: Diagnosis not present

## 2022-10-13 DIAGNOSIS — E039 Hypothyroidism, unspecified: Secondary | ICD-10-CM | POA: Diagnosis not present

## 2022-10-13 DIAGNOSIS — I10 Essential (primary) hypertension: Secondary | ICD-10-CM | POA: Diagnosis not present

## 2022-10-15 DIAGNOSIS — N401 Enlarged prostate with lower urinary tract symptoms: Secondary | ICD-10-CM | POA: Diagnosis not present

## 2022-10-15 DIAGNOSIS — R81 Glycosuria: Secondary | ICD-10-CM | POA: Diagnosis not present

## 2022-10-22 DIAGNOSIS — E291 Testicular hypofunction: Secondary | ICD-10-CM | POA: Diagnosis not present

## 2022-11-05 DIAGNOSIS — E291 Testicular hypofunction: Secondary | ICD-10-CM | POA: Diagnosis not present

## 2022-11-19 DIAGNOSIS — E291 Testicular hypofunction: Secondary | ICD-10-CM | POA: Diagnosis not present

## 2022-12-03 DIAGNOSIS — R3911 Hesitancy of micturition: Secondary | ICD-10-CM | POA: Diagnosis not present

## 2022-12-03 DIAGNOSIS — E291 Testicular hypofunction: Secondary | ICD-10-CM | POA: Diagnosis not present

## 2022-12-03 DIAGNOSIS — R81 Glycosuria: Secondary | ICD-10-CM | POA: Diagnosis not present

## 2022-12-03 DIAGNOSIS — N5314 Retrograde ejaculation: Secondary | ICD-10-CM | POA: Diagnosis not present

## 2022-12-03 DIAGNOSIS — N401 Enlarged prostate with lower urinary tract symptoms: Secondary | ICD-10-CM | POA: Diagnosis not present

## 2022-12-17 DIAGNOSIS — E291 Testicular hypofunction: Secondary | ICD-10-CM | POA: Diagnosis not present

## 2022-12-31 DIAGNOSIS — E291 Testicular hypofunction: Secondary | ICD-10-CM | POA: Diagnosis not present

## 2023-01-12 DIAGNOSIS — E291 Testicular hypofunction: Secondary | ICD-10-CM | POA: Diagnosis not present

## 2023-01-12 DIAGNOSIS — Z23 Encounter for immunization: Secondary | ICD-10-CM | POA: Diagnosis not present

## 2023-01-12 DIAGNOSIS — Z79899 Other long term (current) drug therapy: Secondary | ICD-10-CM | POA: Diagnosis not present

## 2023-01-12 DIAGNOSIS — G4700 Insomnia, unspecified: Secondary | ICD-10-CM | POA: Diagnosis not present

## 2023-01-12 DIAGNOSIS — E039 Hypothyroidism, unspecified: Secondary | ICD-10-CM | POA: Diagnosis not present

## 2023-01-12 DIAGNOSIS — E1140 Type 2 diabetes mellitus with diabetic neuropathy, unspecified: Secondary | ICD-10-CM | POA: Diagnosis not present

## 2023-01-12 DIAGNOSIS — E1165 Type 2 diabetes mellitus with hyperglycemia: Secondary | ICD-10-CM | POA: Diagnosis not present

## 2023-01-12 DIAGNOSIS — E785 Hyperlipidemia, unspecified: Secondary | ICD-10-CM | POA: Diagnosis not present

## 2023-01-12 DIAGNOSIS — M109 Gout, unspecified: Secondary | ICD-10-CM | POA: Diagnosis not present

## 2023-01-12 DIAGNOSIS — E1169 Type 2 diabetes mellitus with other specified complication: Secondary | ICD-10-CM | POA: Diagnosis not present

## 2023-01-12 DIAGNOSIS — D6869 Other thrombophilia: Secondary | ICD-10-CM | POA: Diagnosis not present

## 2023-01-20 DIAGNOSIS — H5203 Hypermetropia, bilateral: Secondary | ICD-10-CM | POA: Diagnosis not present

## 2023-01-26 DIAGNOSIS — E291 Testicular hypofunction: Secondary | ICD-10-CM | POA: Diagnosis not present

## 2023-02-09 DIAGNOSIS — E291 Testicular hypofunction: Secondary | ICD-10-CM | POA: Diagnosis not present

## 2023-02-23 DIAGNOSIS — E291 Testicular hypofunction: Secondary | ICD-10-CM | POA: Diagnosis not present

## 2023-03-02 DIAGNOSIS — J069 Acute upper respiratory infection, unspecified: Secondary | ICD-10-CM | POA: Diagnosis not present

## 2023-03-02 DIAGNOSIS — R059 Cough, unspecified: Secondary | ICD-10-CM | POA: Diagnosis not present

## 2023-03-02 DIAGNOSIS — R519 Headache, unspecified: Secondary | ICD-10-CM | POA: Diagnosis not present

## 2023-03-02 DIAGNOSIS — Z6834 Body mass index (BMI) 34.0-34.9, adult: Secondary | ICD-10-CM | POA: Diagnosis not present

## 2023-03-09 DIAGNOSIS — E291 Testicular hypofunction: Secondary | ICD-10-CM | POA: Diagnosis not present

## 2023-03-23 DIAGNOSIS — E291 Testicular hypofunction: Secondary | ICD-10-CM | POA: Diagnosis not present

## 2023-04-06 DIAGNOSIS — E291 Testicular hypofunction: Secondary | ICD-10-CM | POA: Diagnosis not present

## 2023-04-20 DIAGNOSIS — E291 Testicular hypofunction: Secondary | ICD-10-CM | POA: Diagnosis not present

## 2023-04-27 DIAGNOSIS — E1169 Type 2 diabetes mellitus with other specified complication: Secondary | ICD-10-CM | POA: Diagnosis not present

## 2023-04-27 DIAGNOSIS — Z79899 Other long term (current) drug therapy: Secondary | ICD-10-CM | POA: Diagnosis not present

## 2023-04-27 DIAGNOSIS — E785 Hyperlipidemia, unspecified: Secondary | ICD-10-CM | POA: Diagnosis not present

## 2023-04-27 DIAGNOSIS — E039 Hypothyroidism, unspecified: Secondary | ICD-10-CM | POA: Diagnosis not present

## 2023-04-27 DIAGNOSIS — Z139 Encounter for screening, unspecified: Secondary | ICD-10-CM | POA: Diagnosis not present

## 2023-04-27 DIAGNOSIS — Z9181 History of falling: Secondary | ICD-10-CM | POA: Diagnosis not present

## 2023-04-27 DIAGNOSIS — M109 Gout, unspecified: Secondary | ICD-10-CM | POA: Diagnosis not present

## 2023-04-27 DIAGNOSIS — E1165 Type 2 diabetes mellitus with hyperglycemia: Secondary | ICD-10-CM | POA: Diagnosis not present

## 2023-04-27 DIAGNOSIS — E291 Testicular hypofunction: Secondary | ICD-10-CM | POA: Diagnosis not present

## 2023-04-30 DIAGNOSIS — H903 Sensorineural hearing loss, bilateral: Secondary | ICD-10-CM | POA: Diagnosis not present

## 2023-04-30 DIAGNOSIS — H6123 Impacted cerumen, bilateral: Secondary | ICD-10-CM | POA: Diagnosis not present

## 2023-05-04 DIAGNOSIS — E291 Testicular hypofunction: Secondary | ICD-10-CM | POA: Diagnosis not present

## 2023-05-18 DIAGNOSIS — E291 Testicular hypofunction: Secondary | ICD-10-CM | POA: Diagnosis not present

## 2023-06-01 DIAGNOSIS — E291 Testicular hypofunction: Secondary | ICD-10-CM | POA: Diagnosis not present

## 2023-06-11 DIAGNOSIS — E1169 Type 2 diabetes mellitus with other specified complication: Secondary | ICD-10-CM | POA: Diagnosis not present

## 2023-06-11 DIAGNOSIS — E1142 Type 2 diabetes mellitus with diabetic polyneuropathy: Secondary | ICD-10-CM | POA: Diagnosis not present

## 2023-06-11 DIAGNOSIS — I4819 Other persistent atrial fibrillation: Secondary | ICD-10-CM | POA: Diagnosis not present

## 2023-06-11 DIAGNOSIS — M109 Gout, unspecified: Secondary | ICD-10-CM | POA: Diagnosis not present

## 2023-06-11 DIAGNOSIS — E1159 Type 2 diabetes mellitus with other circulatory complications: Secondary | ICD-10-CM | POA: Diagnosis not present

## 2023-06-11 DIAGNOSIS — G4733 Obstructive sleep apnea (adult) (pediatric): Secondary | ICD-10-CM | POA: Diagnosis not present

## 2023-06-11 DIAGNOSIS — N529 Male erectile dysfunction, unspecified: Secondary | ICD-10-CM | POA: Diagnosis not present

## 2023-06-11 DIAGNOSIS — N401 Enlarged prostate with lower urinary tract symptoms: Secondary | ICD-10-CM | POA: Diagnosis not present

## 2023-06-11 DIAGNOSIS — E291 Testicular hypofunction: Secondary | ICD-10-CM | POA: Diagnosis not present

## 2023-06-11 DIAGNOSIS — N138 Other obstructive and reflux uropathy: Secondary | ICD-10-CM | POA: Diagnosis not present

## 2023-06-11 DIAGNOSIS — E039 Hypothyroidism, unspecified: Secondary | ICD-10-CM | POA: Diagnosis not present

## 2023-06-15 DIAGNOSIS — E291 Testicular hypofunction: Secondary | ICD-10-CM | POA: Diagnosis not present

## 2023-06-29 DIAGNOSIS — E291 Testicular hypofunction: Secondary | ICD-10-CM | POA: Diagnosis not present

## 2023-07-09 DIAGNOSIS — M62830 Muscle spasm of back: Secondary | ICD-10-CM | POA: Diagnosis not present

## 2023-07-13 DIAGNOSIS — E291 Testicular hypofunction: Secondary | ICD-10-CM | POA: Diagnosis not present

## 2023-07-20 DIAGNOSIS — M545 Low back pain, unspecified: Secondary | ICD-10-CM | POA: Diagnosis not present

## 2023-07-20 DIAGNOSIS — N401 Enlarged prostate with lower urinary tract symptoms: Secondary | ICD-10-CM | POA: Diagnosis not present

## 2023-07-20 DIAGNOSIS — N138 Other obstructive and reflux uropathy: Secondary | ICD-10-CM | POA: Diagnosis not present

## 2023-07-23 DIAGNOSIS — E1142 Type 2 diabetes mellitus with diabetic polyneuropathy: Secondary | ICD-10-CM | POA: Diagnosis not present

## 2023-07-23 DIAGNOSIS — E1169 Type 2 diabetes mellitus with other specified complication: Secondary | ICD-10-CM | POA: Diagnosis not present

## 2023-07-23 DIAGNOSIS — I4819 Other persistent atrial fibrillation: Secondary | ICD-10-CM | POA: Diagnosis not present

## 2023-07-23 DIAGNOSIS — Z7901 Long term (current) use of anticoagulants: Secondary | ICD-10-CM | POA: Diagnosis not present

## 2023-07-23 DIAGNOSIS — I4892 Unspecified atrial flutter: Secondary | ICD-10-CM | POA: Diagnosis not present

## 2023-07-23 DIAGNOSIS — E785 Hyperlipidemia, unspecified: Secondary | ICD-10-CM | POA: Diagnosis not present

## 2023-07-23 DIAGNOSIS — E1159 Type 2 diabetes mellitus with other circulatory complications: Secondary | ICD-10-CM | POA: Diagnosis not present

## 2023-07-23 DIAGNOSIS — I152 Hypertension secondary to endocrine disorders: Secondary | ICD-10-CM | POA: Diagnosis not present

## 2023-07-27 DIAGNOSIS — E291 Testicular hypofunction: Secondary | ICD-10-CM | POA: Diagnosis not present

## 2023-07-28 DIAGNOSIS — E291 Testicular hypofunction: Secondary | ICD-10-CM | POA: Diagnosis not present

## 2023-07-28 DIAGNOSIS — N529 Male erectile dysfunction, unspecified: Secondary | ICD-10-CM | POA: Diagnosis not present

## 2023-07-28 DIAGNOSIS — N401 Enlarged prostate with lower urinary tract symptoms: Secondary | ICD-10-CM | POA: Diagnosis not present

## 2023-07-28 DIAGNOSIS — N138 Other obstructive and reflux uropathy: Secondary | ICD-10-CM | POA: Diagnosis not present

## 2023-08-12 DIAGNOSIS — E291 Testicular hypofunction: Secondary | ICD-10-CM | POA: Diagnosis not present

## 2023-08-27 DIAGNOSIS — E291 Testicular hypofunction: Secondary | ICD-10-CM | POA: Diagnosis not present

## 2023-09-04 DIAGNOSIS — R059 Cough, unspecified: Secondary | ICD-10-CM | POA: Diagnosis not present

## 2023-09-04 DIAGNOSIS — R509 Fever, unspecified: Secondary | ICD-10-CM | POA: Diagnosis not present

## 2023-09-08 DIAGNOSIS — E1142 Type 2 diabetes mellitus with diabetic polyneuropathy: Secondary | ICD-10-CM | POA: Diagnosis not present

## 2023-09-08 DIAGNOSIS — E291 Testicular hypofunction: Secondary | ICD-10-CM | POA: Diagnosis not present

## 2023-09-08 DIAGNOSIS — D751 Secondary polycythemia: Secondary | ICD-10-CM | POA: Diagnosis not present

## 2023-09-08 DIAGNOSIS — M545 Low back pain, unspecified: Secondary | ICD-10-CM | POA: Diagnosis not present

## 2023-09-10 DIAGNOSIS — E291 Testicular hypofunction: Secondary | ICD-10-CM | POA: Diagnosis not present

## 2023-09-24 DIAGNOSIS — E291 Testicular hypofunction: Secondary | ICD-10-CM | POA: Diagnosis not present

## 2023-10-08 DIAGNOSIS — E291 Testicular hypofunction: Secondary | ICD-10-CM | POA: Diagnosis not present

## 2023-10-20 DIAGNOSIS — L219 Seborrheic dermatitis, unspecified: Secondary | ICD-10-CM | POA: Diagnosis not present

## 2023-10-20 DIAGNOSIS — L814 Other melanin hyperpigmentation: Secondary | ICD-10-CM | POA: Diagnosis not present

## 2023-10-20 DIAGNOSIS — L578 Other skin changes due to chronic exposure to nonionizing radiation: Secondary | ICD-10-CM | POA: Diagnosis not present

## 2023-10-20 DIAGNOSIS — L820 Inflamed seborrheic keratosis: Secondary | ICD-10-CM | POA: Diagnosis not present

## 2023-10-22 DIAGNOSIS — E291 Testicular hypofunction: Secondary | ICD-10-CM | POA: Diagnosis not present

## 2023-11-05 DIAGNOSIS — E291 Testicular hypofunction: Secondary | ICD-10-CM | POA: Diagnosis not present
